# Patient Record
Sex: Female | Born: 1980 | Race: White | Hispanic: No | Marital: Single | Smoking: Never smoker
Health system: Southern US, Community
[De-identification: ages and names within clinical notes are randomized; demographics above are authoritative.]

## PROBLEM LIST (undated history)

## (undated) ENCOUNTER — Inpatient Hospital Stay (HOSPITAL_COMMUNITY): Payer: Self-pay

## (undated) DIAGNOSIS — M2550 Pain in unspecified joint: Secondary | ICD-10-CM

## (undated) DIAGNOSIS — K219 Gastro-esophageal reflux disease without esophagitis: Secondary | ICD-10-CM

## (undated) DIAGNOSIS — F429 Obsessive-compulsive disorder, unspecified: Secondary | ICD-10-CM

## (undated) DIAGNOSIS — G4700 Insomnia, unspecified: Secondary | ICD-10-CM

## (undated) DIAGNOSIS — J45909 Unspecified asthma, uncomplicated: Secondary | ICD-10-CM

## (undated) DIAGNOSIS — F1193 Opioid use, unspecified with withdrawal: Secondary | ICD-10-CM

## (undated) DIAGNOSIS — M26609 Unspecified temporomandibular joint disorder, unspecified side: Secondary | ICD-10-CM

## (undated) DIAGNOSIS — G43009 Migraine without aura, not intractable, without status migrainosus: Secondary | ICD-10-CM

## (undated) DIAGNOSIS — F32A Depression, unspecified: Secondary | ICD-10-CM

## (undated) DIAGNOSIS — F319 Bipolar disorder, unspecified: Secondary | ICD-10-CM

## (undated) DIAGNOSIS — K5790 Diverticulosis of intestine, part unspecified, without perforation or abscess without bleeding: Secondary | ICD-10-CM

## (undated) DIAGNOSIS — G8929 Other chronic pain: Secondary | ICD-10-CM

## (undated) DIAGNOSIS — Z8669 Personal history of other diseases of the nervous system and sense organs: Secondary | ICD-10-CM

## (undated) DIAGNOSIS — F329 Major depressive disorder, single episode, unspecified: Secondary | ICD-10-CM

## (undated) DIAGNOSIS — F11988 Opioid use, unspecified with other opioid-induced disorder: Secondary | ICD-10-CM

## (undated) DIAGNOSIS — F419 Anxiety disorder, unspecified: Secondary | ICD-10-CM

## (undated) DIAGNOSIS — R002 Palpitations: Secondary | ICD-10-CM

## (undated) DIAGNOSIS — F410 Panic disorder [episodic paroxysmal anxiety] without agoraphobia: Secondary | ICD-10-CM

## (undated) DIAGNOSIS — M1990 Unspecified osteoarthritis, unspecified site: Secondary | ICD-10-CM

## (undated) DIAGNOSIS — F1123 Opioid dependence with withdrawal: Secondary | ICD-10-CM

## (undated) DIAGNOSIS — F1120 Opioid dependence, uncomplicated: Secondary | ICD-10-CM

## (undated) DIAGNOSIS — F4310 Post-traumatic stress disorder, unspecified: Secondary | ICD-10-CM

## (undated) DIAGNOSIS — D649 Anemia, unspecified: Secondary | ICD-10-CM

## (undated) DIAGNOSIS — G5600 Carpal tunnel syndrome, unspecified upper limb: Secondary | ICD-10-CM

## (undated) DIAGNOSIS — M549 Dorsalgia, unspecified: Secondary | ICD-10-CM

## (undated) HISTORY — PX: TOTAL KNEE ARTHROPLASTY: SHX125

## (undated) HISTORY — PX: ANTERIOR CRUCIATE LIGAMENT REPAIR: SHX115

## (undated) HISTORY — DX: Palpitations: R00.2

## (undated) HISTORY — PX: BACK SURGERY: SHX140

## (undated) HISTORY — PX: TOOTH EXTRACTION: SUR596

## (undated) HISTORY — PX: DILATION AND CURETTAGE OF UTERUS: SHX78

## (undated) HISTORY — DX: Post-traumatic stress disorder, unspecified: F43.10

## (undated) HISTORY — DX: Opioid use, unspecified with other opioid-induced disorder: F11.988

## (undated) HISTORY — DX: Unspecified temporomandibular joint disorder, unspecified side: M26.609

## (undated) HISTORY — DX: Opioid dependence, uncomplicated: F11.20

## (undated) HISTORY — DX: Opioid use, unspecified with withdrawal: F11.93

## (undated) HISTORY — PX: COLONOSCOPY: SHX174

## (undated) HISTORY — DX: Migraine without aura, not intractable, without status migrainosus: G43.009

## (undated) HISTORY — DX: Opioid dependence with withdrawal: F11.23

---

## 2001-02-24 ENCOUNTER — Emergency Department (HOSPITAL_COMMUNITY): Admission: EM | Admit: 2001-02-24 | Discharge: 2001-02-24 | Payer: Self-pay | Admitting: Emergency Medicine

## 2001-07-17 ENCOUNTER — Emergency Department (HOSPITAL_COMMUNITY): Admission: EM | Admit: 2001-07-17 | Discharge: 2001-07-17 | Payer: Self-pay | Admitting: Emergency Medicine

## 2002-03-23 ENCOUNTER — Encounter: Admission: RE | Admit: 2002-03-23 | Discharge: 2002-06-21 | Payer: Self-pay | Admitting: Internal Medicine

## 2002-10-24 ENCOUNTER — Other Ambulatory Visit: Admission: RE | Admit: 2002-10-24 | Discharge: 2002-10-24 | Payer: Self-pay | Admitting: Obstetrics and Gynecology

## 2003-09-03 ENCOUNTER — Other Ambulatory Visit: Admission: RE | Admit: 2003-09-03 | Discharge: 2003-09-03 | Payer: Self-pay | Admitting: Obstetrics and Gynecology

## 2004-05-20 ENCOUNTER — Emergency Department (HOSPITAL_COMMUNITY): Admission: EM | Admit: 2004-05-20 | Discharge: 2004-05-20 | Payer: Self-pay | Admitting: Emergency Medicine

## 2005-11-09 HISTORY — PX: DILATION AND EVACUATION: SHX1459

## 2007-06-14 ENCOUNTER — Inpatient Hospital Stay (HOSPITAL_COMMUNITY): Admission: AD | Admit: 2007-06-14 | Discharge: 2007-06-16 | Payer: Self-pay | Admitting: Obstetrics and Gynecology

## 2008-08-22 ENCOUNTER — Ambulatory Visit (HOSPITAL_COMMUNITY): Admission: RE | Admit: 2008-08-22 | Discharge: 2008-08-22 | Payer: Self-pay | Admitting: Family Medicine

## 2008-10-07 ENCOUNTER — Encounter (INDEPENDENT_AMBULATORY_CARE_PROVIDER_SITE_OTHER): Payer: Self-pay | Admitting: Obstetrics and Gynecology

## 2008-10-07 ENCOUNTER — Inpatient Hospital Stay (HOSPITAL_COMMUNITY): Admission: AD | Admit: 2008-10-07 | Discharge: 2008-10-09 | Payer: Self-pay | Admitting: Obstetrics and Gynecology

## 2009-10-20 IMAGING — US US OB COMP +14 WK
1 series · 14 of 28 positions shown · non-contrast
Comparison: none

OBSTETRICAL ULTRASOUND:
 This ultrasound exam was performed in the [HOSPITAL] Ultrasound Department.  The OB US report was generated in the AS system, and faxed to the ordering physician.  This report is also available in [REDACTED] PACS.

[Series 1: us ob detail +14 wk · 14 of 70 slices shown]
[im 3/70]
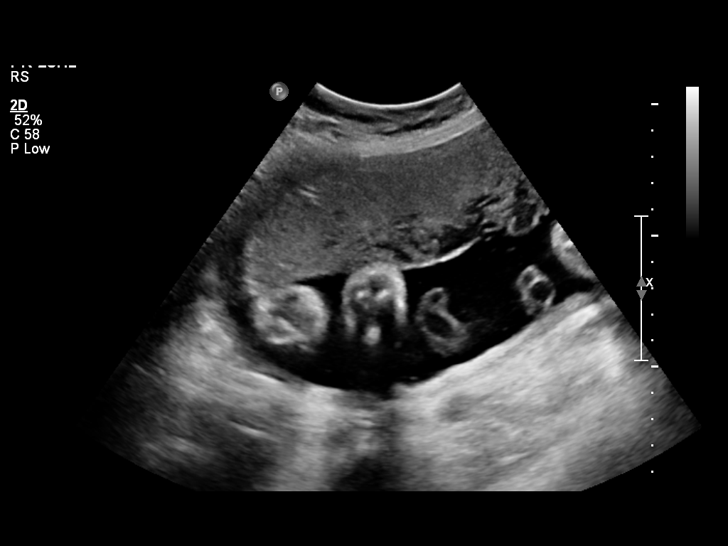
[im 8/70]
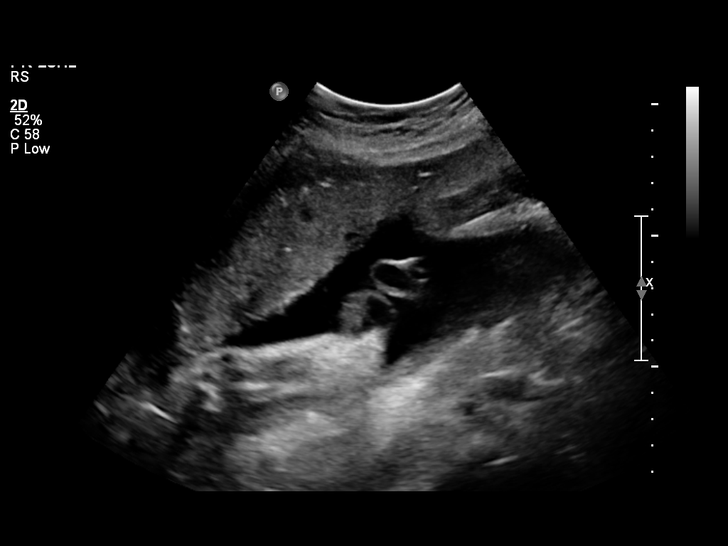
[im 13/70]
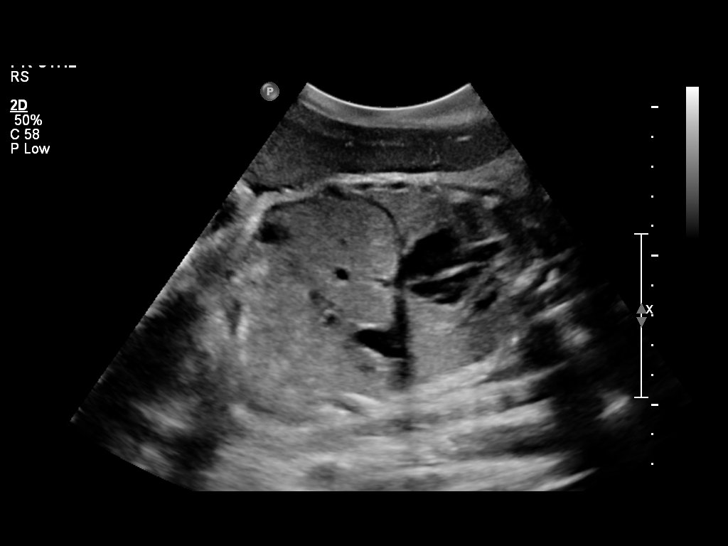
[im 18/70]
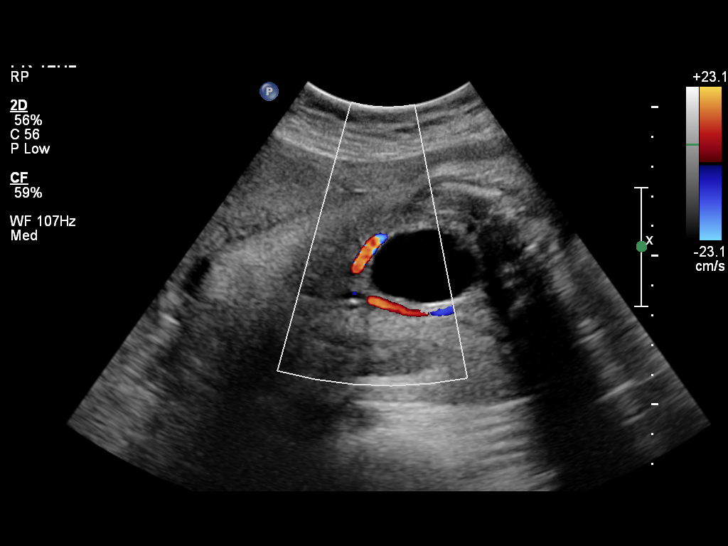
[im 24/70]
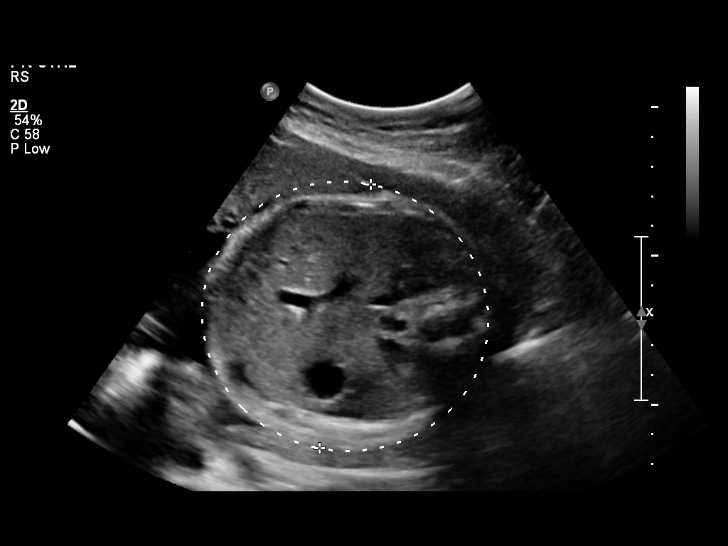
[im 29/70]
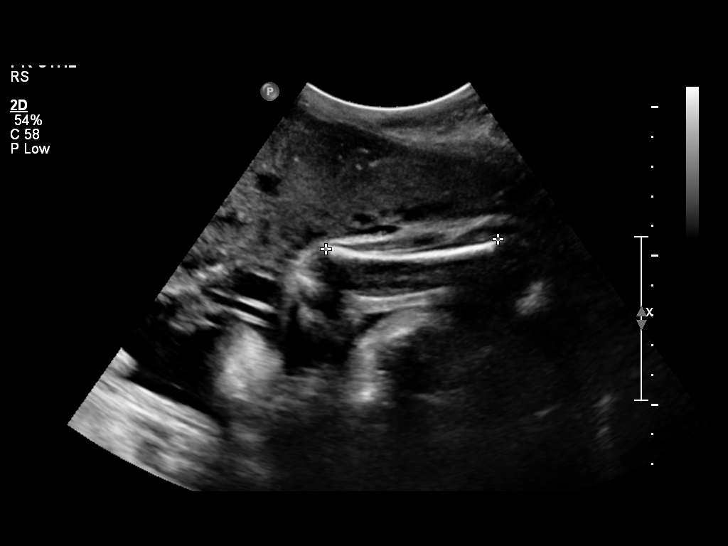
[im 34/70]
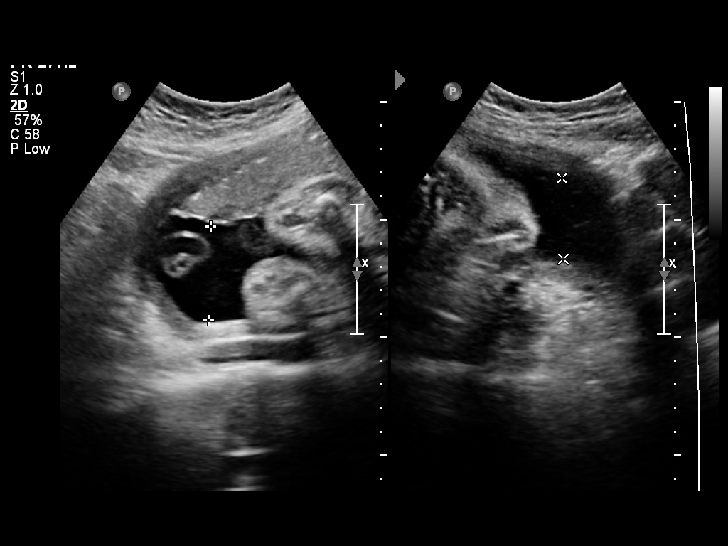
[im 39/70]
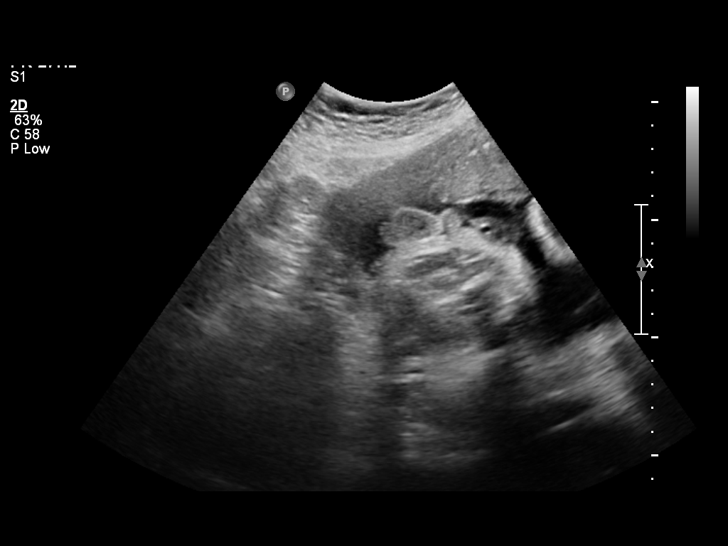
[im 44/70]
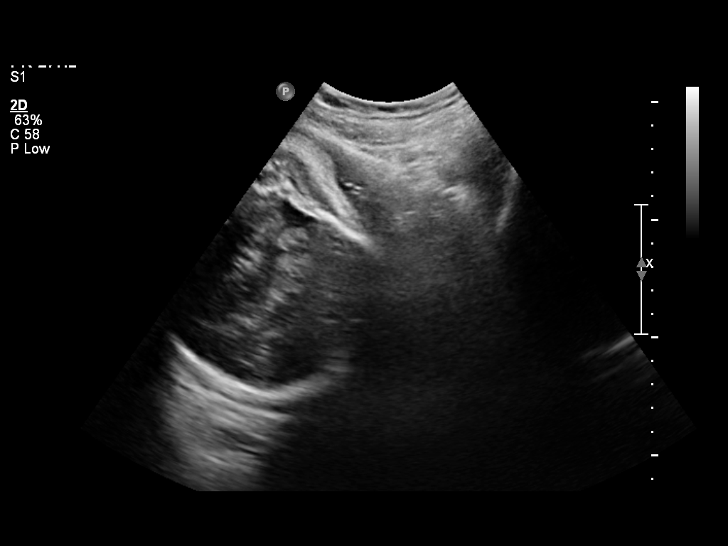
[im 49/70]
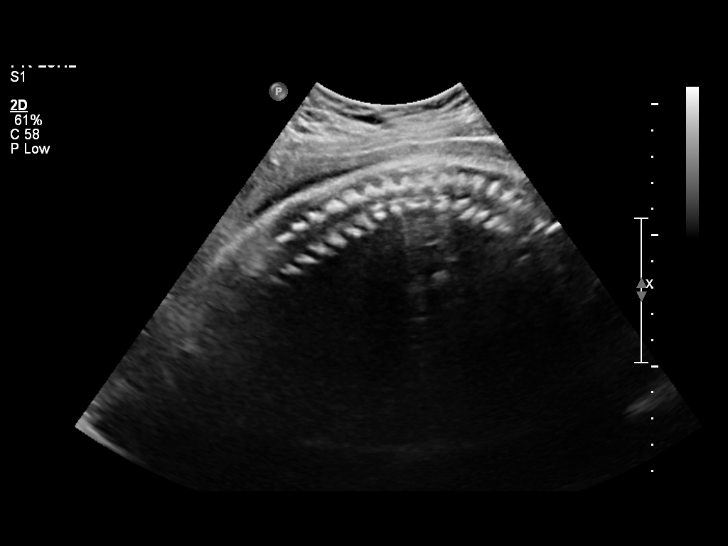
[im 54/70]
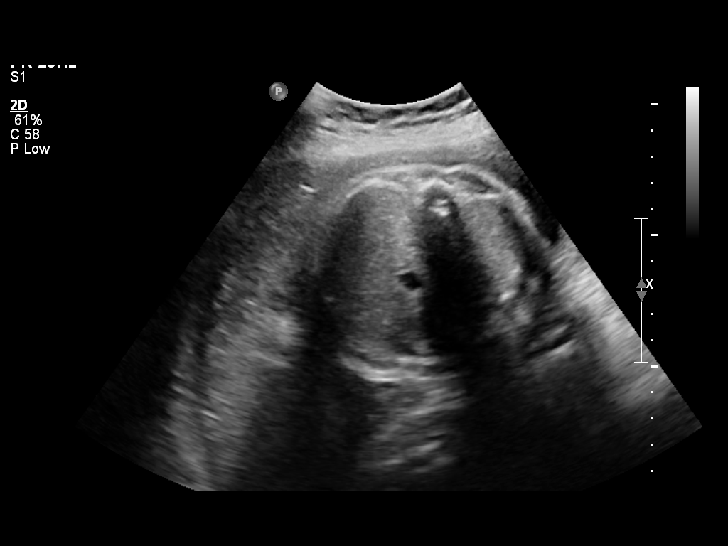
[im 59/70]
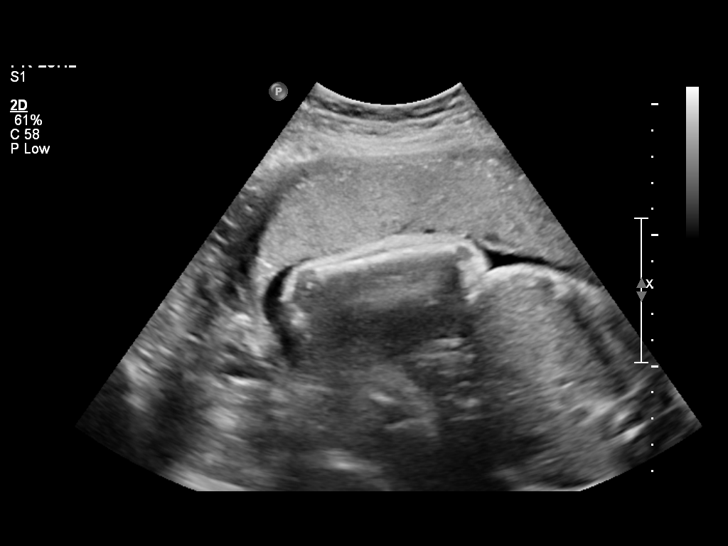
[im 64/70]
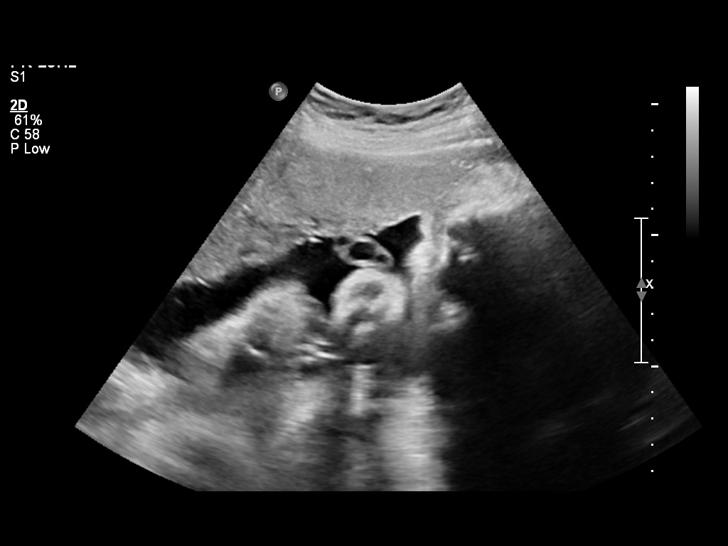
[im 70/70]
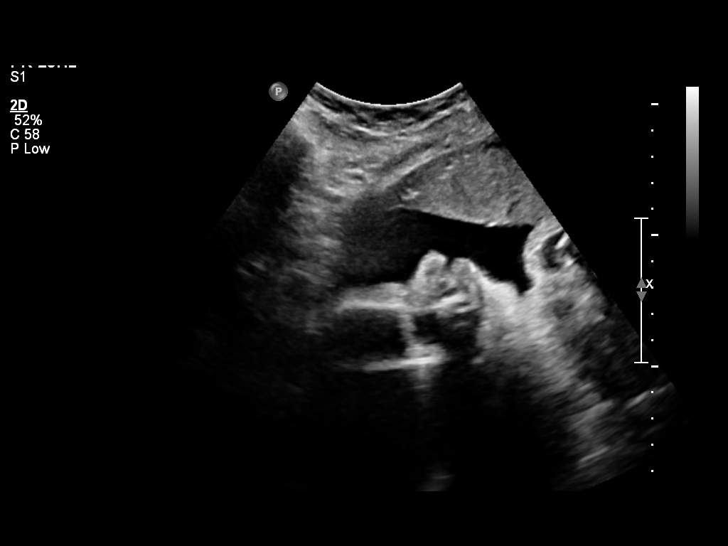

[14 of 28 positions shown; findings below may reference images not displayed]

IMPRESSION: See [facility] Obstetric US report.

## 2010-11-30 ENCOUNTER — Encounter: Payer: Self-pay | Admitting: Sports Medicine

## 2011-08-11 LAB — BASIC METABOLIC PANEL
CO2: 30 mEq/L (ref 19–32)
Chloride: 102 mEq/L (ref 96–112)
GFR calc non Af Amer: >60 mL/min (ref >60)
Glucose, Bld: 82 mg/dL (ref 70–99)
Potassium: 4.2 mEq/L (ref 3.5–5.1)

## 2011-08-11 LAB — GLUCOSE, CAPILLARY: Glucose-Capillary: 84 mg/dL (ref 70–99)

## 2011-08-11 LAB — CBC
HCT: 34.5 % — ABNORMAL LOW (ref 36.0–46.0)
HCT: 34.7 % — ABNORMAL LOW (ref 36.0–46.0)
MCHC: 34 g/dL (ref 30.0–36.0)
MCHC: 34.3 g/dL (ref 30.0–36.0)
MCHC: 34.4 g/dL (ref 30.0–36.0)
MCV: 97.8 fL (ref 78.0–100.0)
MCV: 98.2 fL (ref 78.0–100.0)
RBC: 3.22 MIL/uL — ABNORMAL LOW (ref 3.87–5.11)
WBC: 10.8 10*3/uL — ABNORMAL HIGH (ref 4.0–10.5)
WBC: 9.9 10*3/uL (ref 4.0–10.5)

## 2011-08-11 LAB — RPR: RPR Ser Ql: NONREACTIVE

## 2011-08-24 LAB — RPR: RPR Ser Ql: NONREACTIVE

## 2011-08-24 LAB — CBC
HCT: 34.4 — ABNORMAL LOW
HCT: 38.8
Hemoglobin: 11.9 — ABNORMAL LOW
MCHC: 34.7
MCV: 93.6
Platelets: 211
RBC: 3.72 — ABNORMAL LOW
RBC: 4.14
WBC: 13.4 — ABNORMAL HIGH

## 2014-05-29 ENCOUNTER — Encounter (HOSPITAL_COMMUNITY): Payer: Self-pay | Admitting: Emergency Medicine

## 2014-05-29 ENCOUNTER — Emergency Department (HOSPITAL_COMMUNITY): Payer: Medicaid Other

## 2014-05-29 ENCOUNTER — Emergency Department (HOSPITAL_COMMUNITY)
Admission: EM | Admit: 2014-05-29 | Discharge: 2014-05-29 | Discharge status: Against Medical Advice | Payer: Medicaid Other | Attending: Emergency Medicine | Admitting: Emergency Medicine

## 2014-05-29 DIAGNOSIS — IMO0002 Reserved for concepts with insufficient information to code with codable children: Secondary | ICD-10-CM | POA: Insufficient documentation

## 2014-05-29 DIAGNOSIS — M5412 Radiculopathy, cervical region: Secondary | ICD-10-CM | POA: Diagnosis not present

## 2014-05-29 DIAGNOSIS — M545 Low back pain, unspecified: Secondary | ICD-10-CM | POA: Diagnosis present

## 2014-05-29 DIAGNOSIS — R292 Abnormal reflex: Secondary | ICD-10-CM | POA: Diagnosis not present

## 2014-05-29 DIAGNOSIS — M5416 Radiculopathy, lumbar region: Secondary | ICD-10-CM

## 2014-05-29 DIAGNOSIS — Z79899 Other long term (current) drug therapy: Secondary | ICD-10-CM | POA: Insufficient documentation

## 2014-05-29 LAB — CBC WITH DIFFERENTIAL/PLATELET
BASOS PCT: 0 % (ref 0–1)
Basophils Absolute: 0 10*3/uL (ref 0.0–0.1)
EOS ABS: 0.2 10*3/uL (ref 0.0–0.7)
EOS PCT: 2 % (ref 0–5)
HCT: 35.3 % — ABNORMAL LOW (ref 36.0–46.0)
HEMOGLOBIN: 11.7 g/dL — AB (ref 12.0–15.0)
LYMPHS ABS: 2.9 10*3/uL (ref 0.7–4.0)
Lymphocytes Relative: 33 % (ref 12–46)
MCH: 31.9 pg (ref 26.0–34.0)
MCHC: 33.1 g/dL (ref 30.0–36.0)
MCV: 96.2 fL (ref 78.0–100.0)
MONO ABS: 0.7 10*3/uL (ref 0.1–1.0)
Monocytes Relative: 8 % (ref 3–12)
Neutro Abs: 4.9 10*3/uL (ref 1.7–7.7)
Neutrophils Relative %: 57 % (ref 43–77)
Platelets: 154 10*3/uL (ref 150–400)
RBC: 3.67 MIL/uL — AB (ref 3.87–5.11)
RDW: 13.2 % (ref 11.5–15.5)
WBC: 8.7 10*3/uL (ref 4.0–10.5)

## 2014-05-29 LAB — URINALYSIS, ROUTINE W REFLEX MICROSCOPIC
BILIRUBIN URINE: NEGATIVE
Glucose, UA: NEGATIVE mg/dL
HGB URINE DIPSTICK: NEGATIVE
Ketones, ur: NEGATIVE mg/dL
LEUKOCYTES UA: NEGATIVE
Nitrite: NEGATIVE
PROTEIN: NEGATIVE mg/dL
SPECIFIC GRAVITY, URINE: 1.025 (ref 1.005–1.030)
Urobilinogen, UA: 1 mg/dL (ref 0.0–1.0)
pH: 6.5 (ref 5.0–8.0)

## 2014-05-29 LAB — I-STAT TROPONIN, ED: TROPONIN I, POC: 0 ng/mL (ref 0.00–0.08)

## 2014-05-29 MED ORDER — HYDROMORPHONE HCL PF 1 MG/ML IJ SOLN
1.0000 mg | Freq: Once | INTRAMUSCULAR | Status: DC
  Filled 2014-05-29: qty 1

## 2014-05-29 MED ORDER — HYDROMORPHONE HCL PF 1 MG/ML IJ SOLN
0.5000 mg | Freq: Once | INTRAMUSCULAR | Status: AC
  Administered 2014-05-29: 0.5 mg via INTRAVENOUS
  Filled 2014-05-29: qty 1

## 2014-05-29 MED ORDER — SODIUM CHLORIDE 0.9 % IV SOLN
Freq: Once | INTRAVENOUS | Status: AC
  Administered 2014-05-29: 16:00:00 via INTRAVENOUS

## 2014-05-29 MED ORDER — ONDANSETRON HCL 4 MG/2ML IJ SOLN
4.0000 mg | Freq: Once | INTRAMUSCULAR | Status: AC
  Administered 2014-05-29: 4 mg via INTRAVENOUS
  Filled 2014-05-29: qty 2

## 2014-05-29 NOTE — ED Notes (Signed)
Pt placed in gown and in bed. Pt monitored by pulse ox, bp cuff, and 12-lead. 
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
## 2014-05-29 NOTE — ED Notes (Signed)
PT returned from scans. Pt monitored by pulse ox, bp cuff, and 12-lead. 

## 2014-05-29 NOTE — ED Notes (Signed)
Patient ambulated to exit of ER without any problems.

## 2014-05-29 NOTE — ED Provider Notes (Signed)
Medical screening examination/treatment/procedure(s) were performed by non-physician practitioner and as supervising physician I was immediately available for consultation/collaboration.   EKG Interpretation None        Shon Baton, MD 05/29/14 2204

## 2014-05-29 NOTE — ED Notes (Signed)
Pt states shes had a history of back pain but this week shes noticed new numbness between her legs and today she lost control of her bladder.

## 2014-06-20 ENCOUNTER — Encounter (HOSPITAL_COMMUNITY): Payer: Self-pay

## 2014-06-20 ENCOUNTER — Inpatient Hospital Stay (HOSPITAL_COMMUNITY): Payer: Medicaid Other

## 2014-06-20 ENCOUNTER — Inpatient Hospital Stay (HOSPITAL_COMMUNITY)
Admission: AD | Admit: 2014-06-20 | Discharge: 2014-06-20 | Disposition: A | Discharge status: Home / Self Care | Payer: Medicaid Other | Source: Ambulatory Visit | Attending: Obstetrics and Gynecology | Admitting: Obstetrics and Gynecology

## 2014-06-20 DIAGNOSIS — R109 Unspecified abdominal pain: Secondary | ICD-10-CM | POA: Insufficient documentation

## 2014-06-20 DIAGNOSIS — N83209 Unspecified ovarian cyst, unspecified side: Secondary | ICD-10-CM | POA: Diagnosis not present

## 2014-06-20 DIAGNOSIS — Z30432 Encounter for removal of intrauterine contraceptive device: Secondary | ICD-10-CM | POA: Insufficient documentation

## 2014-06-20 DIAGNOSIS — R102 Pelvic and perineal pain: Secondary | ICD-10-CM

## 2014-06-20 DIAGNOSIS — N949 Unspecified condition associated with female genital organs and menstrual cycle: Secondary | ICD-10-CM | POA: Insufficient documentation

## 2014-06-20 LAB — URINALYSIS, ROUTINE W REFLEX MICROSCOPIC
BILIRUBIN URINE: NEGATIVE
Glucose, UA: NEGATIVE mg/dL
Hgb urine dipstick: NEGATIVE
Ketones, ur: 15 mg/dL — AB
LEUKOCYTES UA: NEGATIVE
NITRITE: NEGATIVE
PH: 6 (ref 5.0–8.0)
PROTEIN: NEGATIVE mg/dL
Specific Gravity, Urine: 1.025 (ref 1.005–1.030)
Urobilinogen, UA: 0.2 mg/dL (ref 0.0–1.0)

## 2014-06-20 LAB — CBC
HEMATOCRIT: 39.3 % (ref 36.0–46.0)
HEMOGLOBIN: 13.6 g/dL (ref 12.0–15.0)
MCH: 33.2 pg (ref 26.0–34.0)
MCHC: 34.6 g/dL (ref 30.0–36.0)
MCV: 95.9 fL (ref 78.0–100.0)
Platelets: 172 10*3/uL (ref 150–400)
RBC: 4.1 MIL/uL (ref 3.87–5.11)
RDW: 13.2 % (ref 11.5–15.5)
WBC: 9 10*3/uL (ref 4.0–10.5)

## 2014-06-20 LAB — WET PREP, GENITAL
Clue Cells Wet Prep HPF POC: NONE SEEN
Trich, Wet Prep: NONE SEEN
WBC, Wet Prep HPF POC: NONE SEEN
Yeast Wet Prep HPF POC: NONE SEEN

## 2014-06-20 LAB — POCT PREGNANCY, URINE: Preg Test, Ur: NEGATIVE

## 2014-06-20 MED ORDER — KETOROLAC TROMETHAMINE 60 MG/2ML IM SOLN
60.0000 mg | Freq: Once | INTRAMUSCULAR | Status: AC
  Administered 2014-06-20: 60 mg via INTRAMUSCULAR
  Filled 2014-06-20: qty 2

## 2014-06-20 MED ORDER — ONDANSETRON 8 MG PO TBDP: 8.0000 mg | ORAL_TABLET | Freq: Once | ORAL | Status: DC

## 2014-06-20 MED ORDER — ONDANSETRON 8 MG PO TBDP
8.0000 mg | ORAL_TABLET | Freq: Once | ORAL | Status: AC
  Administered 2014-06-20: 8 mg via ORAL
  Filled 2014-06-20: qty 1

## 2014-06-20 NOTE — MAU Note (Signed)
Pain started 2 months ago. Severe pain last 2 wks. Dx with ovarian cysts. Office told her to come here for IUD removal and to be thoroughly checked out.

## 2014-06-20 NOTE — Discharge Instructions (Signed)
Levonorgestrel intrauterine device (IUD) What is this medicine? LEVONORGESTREL IUD (LEE voe nor jes trel) is a contraceptive (birth control) device. The device is placed inside the uterus by a healthcare professional. It is used to prevent pregnancy and can also be used to treat heavy bleeding that occurs during your period. Depending on the device, it can be used for 3 to 5 years. This medicine may be used for other purposes; ask your health care provider or pharmacist if you have questions. COMMON BRAND NAME(S): LILETTA, Mirena, Skyla What should I tell my health care provider before I take this medicine? They need to know if you have any of these conditions: -abnormal Pap smear -cancer of the breast, uterus, or cervix -diabetes -endometritis -genital or pelvic infection now or in the past -have more than one sexual partner or your partner has more than one partner -heart disease -history of an ectopic or tubal pregnancy -immune system problems -IUD in place -liver disease or tumor -problems with blood clots or take blood-thinners -use intravenous drugs -uterus of unusual shape -vaginal bleeding that has not been explained -an unusual or allergic reaction to levonorgestrel, other hormones, silicone, or polyethylene, medicines, foods, dyes, or preservatives -pregnant or trying to get pregnant -breast-feeding How should I use this medicine? This device is placed inside the uterus by a health care professional. Talk to your pediatrician regarding the use of this medicine in children. Special care may be needed. Overdosage: If you think you have taken too much of this medicine contact a poison control center or emergency room at once. NOTE: This medicine is only for you. Do not share this medicine with others. What if I miss a dose? This does not apply. What may interact with this medicine? Do not take this medicine with any of the following  medications: -amprenavir -bosentan -fosamprenavir This medicine may also interact with the following medications: -aprepitant -barbiturate medicines for inducing sleep or treating seizures -bexarotene -griseofulvin -medicines to treat seizures like carbamazepine, ethotoin, felbamate, oxcarbazepine, phenytoin, topiramate -modafinil -pioglitazone -rifabutin -rifampin -rifapentine -some medicines to treat HIV infection like atazanavir, indinavir, lopinavir, nelfinavir, tipranavir, ritonavir -St. John's wort -warfarin This list may not describe all possible interactions. Give your health care provider a list of all the medicines, herbs, non-prescription drugs, or dietary supplements you use. Also tell them if you smoke, drink alcohol, or use illegal drugs. Some items may interact with your medicine. What should I watch for while using this medicine? Visit your doctor or health care professional for regular check ups. See your doctor if you or your partner has sexual contact with others, becomes HIV positive, or gets a sexual transmitted disease. This product does not protect you against HIV infection (AIDS) or other sexually transmitted diseases. You can check the placement of the IUD yourself by reaching up to the top of your vagina with clean fingers to feel the threads. Do not pull on the threads. It is a good habit to check placement after each menstrual period. Call your doctor right away if you feel more of the IUD than just the threads or if you cannot feel the threads at all. The IUD may come out by itself. You may become pregnant if the device comes out. If you notice that the IUD has come out use a backup birth control method like condoms and call your health care provider. Using tampons will not change the position of the IUD and are okay to use during your period. What side effects may   I notice from receiving this medicine? Side effects that you should report to your doctor or  health care professional as soon as possible: -allergic reactions like skin rash, itching or hives, swelling of the face, lips, or tongue -fever, flu-like symptoms -genital sores -high blood pressure -no menstrual period for 6 weeks during use -pain, swelling, warmth in the leg -pelvic pain or tenderness -severe or sudden headache -signs of pregnancy -stomach cramping -sudden shortness of breath -trouble with balance, talking, or walking -unusual vaginal bleeding, discharge -yellowing of the eyes or skin Side effects that usually do not require medical attention (report to your doctor or health care professional if they continue or are bothersome): -acne -breast pain -change in sex drive or performance -changes in weight -cramping, dizziness, or faintness while the device is being inserted -headache -irregular menstrual bleeding within first 3 to 6 months of use -nausea This list may not describe all possible side effects. Call your doctor for medical advice about side effects. You may report side effects to FDA at 1-800-FDA-1088. Where should I keep my medicine? This does not apply. NOTE: This sheet is a summary. It may not cover all possible information. If you have questions about this medicine, talk to your doctor, pharmacist, or health care provider.  2015, [employer]. (2011-11-26 13:54:04)  

## 2014-06-20 NOTE — MAU Note (Addendum)
IUD was to come out in Jan.  Has not had periods while IUD was in place. Has had low grade off and on, highest was 101.

## 2014-06-20 NOTE — MAU Provider Note (Signed)
History     CSN: [id number]  Arrival date and time: 06/20/14 1400   First Provider Initiated Contact with Patient 06/20/14 1841      Chief Complaint  Patient presents with  . Abdominal Pain   HPI  [name] is a 33 y.o. female G5P2 who presents with complaints of abdominal pain; she has an IUD and was due to have it out in January (placed 5 years ago).  She called Dr. Ewell Poe office today to schedule an appointment today and they were unable to get her in the office. She was seen at [facility] ER 1 week ago and diagnosed with a right ovarian cyst 2.6 cm (records reviewed); she was given narcotic pain medication and she has taken it all.  She developed a fever at home one week ago; today her fever was 100.9; she took ibuprofen. Negative fever in MAU.  The patient wants her IUD to come out today.    OB History   Grav Para Term Preterm Abortions TAB SAB Ect Mult Living   5 2              Past Medical History  Diagnosis Date  . Back pain     Past Surgical History  Procedure Laterality Date  . Total knee arthroplasty      History reviewed. No pertinent family history.  History  Substance Use Topics  . Smoking status: Never Smoker   . Smokeless tobacco: Not on file  . Alcohol Use: No    Allergies:  Allergies  Allergen Reactions  . Bee Venom Anaphylaxis    Prescriptions prior to admission  Medication Sig Dispense Refill  . albuterol (PROVENTIL HFA;VENTOLIN HFA) 108 (90 BASE) MCG/ACT inhaler Inhale 2 puffs into the lungs every 6 (six) hours as needed for wheezing or shortness of breath.      . clonazePAM (KLONOPIN) 0.5 MG tablet Take 0.5 mg by mouth 2 (two) times daily.      . diphenhydrAMINE (BENADRYL) 25 MG tablet Take 25 mg by mouth every 8 (eight) hours as needed for allergies.      Marland Kitchen ibuprofen (ADVIL,MOTRIN) 200 MG tablet Take 400 mg by mouth every 6 (six) hours as needed for fever.      . Multiple Vitamins-Minerals (HAIR/SKIN/NAILS PO) Take 1 tablet by  mouth daily.      Marland Kitchen oxyCODONE (OXY IR/ROXICODONE) 5 MG immediate release tablet Take 5 mg by mouth every 6 (six) hours as needed for moderate pain or severe pain.        Results for orders placed during the hospital encounter of 06/20/14 (from the past 48 hour(s))  URINALYSIS, ROUTINE W REFLEX MICROSCOPIC     Status: Abnormal   Collection Time    06/20/14  2:40 PM      Result Value Ref Range   Color, Urine YELLOW  YELLOW   APPearance CLEAR  CLEAR   Specific Gravity, Urine 1.025  1.005 - 1.030   pH 6.0  5.0 - 8.0   Glucose, UA NEGATIVE  NEGATIVE mg/dL   Hgb urine dipstick NEGATIVE  NEGATIVE   Bilirubin Urine NEGATIVE  NEGATIVE   Ketones, ur 15 (*) NEGATIVE mg/dL   Protein, ur NEGATIVE  NEGATIVE mg/dL   Urobilinogen, UA 0.2  0.0 - 1.0 mg/dL   Nitrite NEGATIVE  NEGATIVE   Leukocytes, UA NEGATIVE  NEGATIVE   Comment: MICROSCOPIC NOT DONE ON URINES WITH NEGATIVE PROTEIN, BLOOD, LEUKOCYTES, NITRITE, OR GLUCOSE <1000 mg/dL.  POCT PREGNANCY, URINE  Status: None   Collection Time    06/20/14  6:14 PM      Result Value Ref Range   Preg Test, Ur NEGATIVE  NEGATIVE   Comment:            THE SENSITIVITY OF THIS     METHODOLOGY IS >24 mIU/mL    Review of Systems  Constitutional: Positive for fever and chills.  Gastrointestinal: Positive for abdominal pain (Right lower- mid abdominal pain ). Negative for diarrhea and constipation.  Genitourinary: Negative for dysuria, urgency, frequency and hematuria.       + vaginal discharge. + vaginal bleeding. No dysuria.    Physical Exam   Blood pressure 129/66, pulse 87, temperature 98.2 F (36.8 C), temperature source Oral, resp. rate 18, height 6\' 1"  (1.854 m), weight 75.014 kg (165 lb 6 oz).  Physical Exam  Constitutional: She is oriented to person, place, and time. She appears well-developed and well-nourished. No distress.  HENT:  Head: Normocephalic.  Eyes: Pupils are equal, round, and reactive to light.  Neck: Neck supple.   Cardiovascular: Normal rate.   Respiratory: Effort normal.  GI: Soft. Bowel sounds are normal. She exhibits no distension and no mass. There is no tenderness. There is no rebound and no guarding.  Genitourinary:  Speculum exam: Vagina - Small amount of clear discharge, no odor Cervix - No contact bleeding IUD strings visualized in the cervix; IUD pulled using ring forceps with minimal resistance. No bleeding noted following. Chaperone present for exam.   Musculoskeletal: Normal range of motion.  Neurological: She is alert and oriented to person, place, and time.  Skin: Skin is warm. She is not diaphoretic.  Psychiatric: Her speech is normal. Her mood appears anxious. Her affect is inappropriate. She exhibits a depressed mood.   Results for orders placed during the hospital encounter of 06/20/14 (from the past 24 hour(s))  URINALYSIS, ROUTINE W REFLEX MICROSCOPIC     Status: Abnormal   Collection Time    06/20/14  2:40 PM      Result Value Ref Range   Color, Urine YELLOW  YELLOW   APPearance CLEAR  CLEAR   Specific Gravity, Urine 1.025  1.005 - 1.030   pH 6.0  5.0 - 8.0   Glucose, UA NEGATIVE  NEGATIVE mg/dL   Hgb urine dipstick NEGATIVE  NEGATIVE   Bilirubin Urine NEGATIVE  NEGATIVE   Ketones, ur 15 (*) NEGATIVE mg/dL   Protein, ur NEGATIVE  NEGATIVE mg/dL   Urobilinogen, UA 0.2  0.0 - 1.0 mg/dL   Nitrite NEGATIVE  NEGATIVE   Leukocytes, UA NEGATIVE  NEGATIVE  POCT PREGNANCY, URINE     Status: None   Collection Time    06/20/14  6:14 PM      Result Value Ref Range   Preg Test, Ur NEGATIVE  NEGATIVE  WET PREP, GENITAL     Status: None   Collection Time    06/20/14  7:15 PM      Result Value Ref Range   Yeast Wet Prep HPF POC NONE SEEN  NONE SEEN   Trich, Wet Prep NONE SEEN  NONE SEEN   Clue Cells Wet Prep HPF POC NONE SEEN  NONE SEEN   WBC, Wet Prep HPF POC NONE SEEN  NONE SEEN  CBC     Status: None   Collection Time    06/20/14  8:26 PM      Result Value Ref Range    WBC 9.0  4.0 - 10.5  K/uL   RBC 4.10  3.87 - 5.11 MIL/uL   Hemoglobin 13.6  12.0 - 15.0 g/dL   HCT 57.8  46.9 - 62.9 %   MCV 95.9  78.0 - 100.0 fL   MCH 33.2  26.0 - 34.0 pg   MCHC 34.6  30.0 - 36.0 g/dL   RDW 52.8  41.3 - 24.4 %   Platelets 172  150 - 400 K/uL   US Transvaginal Non-ob  06/20/2014   CLINICAL DATA:  Severe abdominal pain.  EXAM: TRANSABDOMINAL AND TRANSVAGINAL ULTRASOUND OF PELVIS  DOPPLER ULTRASOUND OF OVARIES  TECHNIQUE: Both transabdominal and transvaginal ultrasound examinations of the pelvis were performed. Transabdominal technique was performed for global imaging of the pelvis including uterus, ovaries, adnexal regions, and pelvic cul-de-sac.  It was necessary to proceed with endovaginal exam following the transabdominal exam to visualize the uterus, endometrium and ovaries. Color and duplex Doppler ultrasound was utilized to evaluate blood flow to the ovaries.  COMPARISON:  CT, 05/02/2012  FINDINGS: Uterus  Measurements: 8.2 cm x 3.5 cm x 4.5 cm. No fibroids or other mass visualized.  Endometrium  Thickness: 2.3 mm.  No focal abnormality visualized.  Right ovary  Measurements: 2.7 cm x 3 cm x 2.6 cm. 3 cm simple appearing cyst. No solid mass or complex cysts. No adnexal mass.  Left ovary  Measurements: 2.0 cm x 0.6 cm by 0.9 cm. Normal appearance/no adnexal mass.  Pulsed Doppler evaluation of both ovaries demonstrates normal low-resistance arterial and venous waveforms.  Other findings  No free fluid.  IMPRESSION: 1. 3 cm simple appearing right ovarian cyst. This is almost certainly benign, and no specific imaging follow up is recommended according to the Society of Radiologists in Ultrasound2010 Consensus Conference Statement (D Lenis Noon et al. Management of Asymptomatic Ovarian and Other Adnexal Cysts Imaged at [place]: Society of Radiologists in Ultrasound Consensus Conference Statement 2010. Radiology 256 (Sept 2010): 943-954.). 2. Exam otherwise unremarkable. No findings to explain  this patient's pain.   Electronically Signed   By: Amie Portland M.D.   On: 06/20/2014 20:20   US Pelvis Complete  06/20/2014   CLINICAL DATA:  Severe abdominal pain.  EXAM: TRANSABDOMINAL AND TRANSVAGINAL ULTRASOUND OF PELVIS  DOPPLER ULTRASOUND OF OVARIES  TECHNIQUE: Both transabdominal and transvaginal ultrasound examinations of the pelvis were performed. Transabdominal technique was performed for global imaging of the pelvis including uterus, ovaries, adnexal regions, and pelvic cul-de-sac.  It was necessary to proceed with endovaginal exam following the transabdominal exam to visualize the uterus, endometrium and ovaries. Color and duplex Doppler ultrasound was utilized to evaluate blood flow to the ovaries.  COMPARISON:  CT, 05/02/2012  FINDINGS: Uterus  Measurements: 8.2 cm x 3.5 cm x 4.5 cm. No fibroids or other mass visualized.  Endometrium  Thickness: 2.3 mm.  No focal abnormality visualized.  Right ovary  Measurements: 2.7 cm x 3 cm x 2.6 cm. 3 cm simple appearing cyst. No solid mass or complex cysts. No adnexal mass.  Left ovary  Measurements: 2.0 cm x 0.6 cm by 0.9 cm. Normal appearance/no adnexal mass.  Pulsed Doppler evaluation of both ovaries demonstrates normal low-resistance arterial and venous waveforms.  Other findings  No free fluid.  IMPRESSION: 1. 3 cm simple appearing right ovarian cyst. This is almost certainly benign, and no specific imaging follow up is recommended according to the Society of Radiologists in Ultrasound2010 Consensus Conference Statement Grier Rocher et al. Management of Asymptomatic Ovarian and Other Adnexal Cysts Imaged at [place]: Society  of Radiologists in Ultrasound Consensus Conference Statement 2010. Radiology 256 (Sept 2010): [phone].). 2. Exam otherwise unremarkable. No findings to explain this patient's pain.   Electronically Signed   By: Amie Portland M.D.   On: 06/20/2014 20:20   [place] Art/ven Flow Abd Pelv Doppler  06/20/2014   CLINICAL DATA:  Severe abdominal pain.   EXAM: TRANSABDOMINAL AND TRANSVAGINAL ULTRASOUND OF PELVIS  DOPPLER ULTRASOUND OF OVARIES  TECHNIQUE: Both transabdominal and transvaginal ultrasound examinations of the pelvis were performed. Transabdominal technique was performed for global imaging of the pelvis including uterus, ovaries, adnexal regions, and pelvic cul-de-sac.  It was necessary to proceed with endovaginal exam following the transabdominal exam to visualize the uterus, endometrium and ovaries. Color and duplex Doppler ultrasound was utilized to evaluate blood flow to the ovaries.  COMPARISON:  CT, 05/02/2012  FINDINGS: Uterus  Measurements: 8.2 cm x 3.5 cm x 4.5 cm. No fibroids or other mass visualized.  Endometrium  Thickness: 2.3 mm.  No focal abnormality visualized.  Right ovary  Measurements: 2.7 cm x 3 cm x 2.6 cm. 3 cm simple appearing cyst. No solid mass or complex cysts. No adnexal mass.  Left ovary  Measurements: 2.0 cm x 0.6 cm by 0.9 cm. Normal appearance/no adnexal mass.  Pulsed Doppler evaluation of both ovaries demonstrates normal low-resistance arterial and venous waveforms.  Other findings  No free fluid.  IMPRESSION: 1. 3 cm simple appearing right ovarian cyst. This is almost certainly benign, and no specific imaging follow up is recommended according to the Society of Radiologists in Ultrasound2010 Consensus Conference Statement (D Lenis Noon et al. Management of Asymptomatic Ovarian and Other Adnexal Cysts Imaged at [place]: Society of Radiologists in Ultrasound Consensus Conference Statement 2010. Radiology 256 (Sept 2010): 943-954.). 2. Exam otherwise unremarkable. No findings to explain this patient's pain.   Electronically Signed   By: Amie Portland M.D.   On: 06/20/2014 20:20    MAU Course  Procedures None  MDM CBC Toradol 60 mg IM Consulted with Dr. Claiborne Billings; ok to pull IUD as requested by the patient  Pelvic US to evaluate cyst that was found 1 week ago at another facility.  Report given to [facility] who  resumes care of the patient   Iona Hansen Rasch, NP  06/20/2014, 6:41 PM   2000 - Patient in [place], labs pending. Care assumed from Venia Carbon, NP US shows stable simple appearing ovarian cyst Wet prep is negative CBC is normal Patient may be discharged to follow-up in the office Assessment and Plan  A: Pelvic pain IUD removal  P: Discharge home Rx for Zofran given to patient at patient's request Patient advised to follow-up in the office as scheduled Patient may return to MAU as needed or if her condition were to change or worsen  Freddi Starr, PA-C  06/20/2014 8:44 PM

## 2014-06-21 ENCOUNTER — Other Ambulatory Visit: Payer: Self-pay | Admitting: Neurosurgery

## 2014-06-21 LAB — GC/CHLAMYDIA PROBE AMP
CT PROBE, AMP APTIMA: NEGATIVE
GC PROBE AMP APTIMA: NEGATIVE

## 2014-09-10 ENCOUNTER — Encounter (HOSPITAL_COMMUNITY): Payer: Self-pay

## 2014-10-22 ENCOUNTER — Other Ambulatory Visit: Payer: Self-pay | Admitting: Neurosurgery

## 2014-10-22 ENCOUNTER — Inpatient Hospital Stay (HOSPITAL_COMMUNITY)
Admission: RE | Admit: 2014-10-22 | Discharge: 2014-10-22 | Disposition: A | Discharge status: Home / Self Care | Payer: Medicaid Other | Source: Ambulatory Visit

## 2014-10-22 NOTE — Progress Notes (Signed)
Reached a relative of a pt. On a phone line provided by Dr. Jordan Likes 's office. The relative reports the pt's schedule is cancelled.

## 2014-10-22 NOTE — Progress Notes (Signed)
Call to Dr. Lindalou Hose office for orders for PAT.

## 2014-10-25 MED ORDER — CEFAZOLIN SODIUM-DEXTROSE 2-3 GM-% IV SOLR: 2.0000 g | INTRAVENOUS | Status: DC

## 2014-10-25 MED ORDER — DEXAMETHASONE SODIUM PHOSPHATE 10 MG/ML IJ SOLN: 10.0000 mg | INTRAMUSCULAR | Status: DC

## 2014-10-26 ENCOUNTER — Encounter (HOSPITAL_COMMUNITY): Admission: RE | Payer: Self-pay | Source: Ambulatory Visit

## 2014-10-26 ENCOUNTER — Inpatient Hospital Stay (HOSPITAL_COMMUNITY): Admission: RE | Admit: 2014-10-26 | Payer: Medicaid Other | Source: Ambulatory Visit | Admitting: Neurosurgery

## 2014-10-26 SURGERY — FOR MAXIMUM ACCESS (MAS) POSTERIOR LUMBAR INTERBODY FUSION (PLIF) 2 LEVEL
Anesthesia: General | Site: Back

## 2014-11-09 NOTE — L&D Delivery Note (Signed)
Pt was admitted for induction. She had AROM with clear fluid this am and then pit aug. She followed along a nl labor curve. She pushed for 30 min and had a SVD on one live viable white female over an intact perineum in the ROA position. Shoulder dystocia txd with McRoberts/ Woods screw and suprapubic pressure. Placenta manually removed. No perineal tears. Baby to NBN. NICU was called to delivery

## 2014-12-17 ENCOUNTER — Other Ambulatory Visit: Payer: Self-pay | Admitting: Obstetrics and Gynecology

## 2014-12-17 LAB — OB RESULTS CONSOLE RUBELLA ANTIBODY, IGM: RUBELLA: IMMUNE

## 2014-12-17 LAB — OB RESULTS CONSOLE ABO/RH: RH TYPE: POSITIVE

## 2014-12-17 LAB — OB RESULTS CONSOLE HIV ANTIBODY (ROUTINE TESTING): HIV: NONREACTIVE

## 2014-12-17 LAB — OB RESULTS CONSOLE RPR: RPR: NONREACTIVE

## 2014-12-17 LAB — OB RESULTS CONSOLE GC/CHLAMYDIA
CHLAMYDIA, DNA PROBE: NEGATIVE
GC PROBE AMP, GENITAL: NEGATIVE

## 2014-12-17 LAB — OB RESULTS CONSOLE ANTIBODY SCREEN: Antibody Screen: NEGATIVE

## 2014-12-17 LAB — OB RESULTS CONSOLE HEPATITIS B SURFACE ANTIGEN: HEP B S AG: NEGATIVE

## 2014-12-18 LAB — CYTOLOGY - PAP

## 2015-03-21 ENCOUNTER — Encounter (HOSPITAL_COMMUNITY): Payer: Self-pay | Admitting: *Deleted

## 2015-03-21 ENCOUNTER — Inpatient Hospital Stay (HOSPITAL_COMMUNITY)
Admission: AD | Admit: 2015-03-21 | Discharge: 2015-03-21 | Disposition: A | Discharge status: Home / Self Care | Payer: Medicaid Other | Source: Ambulatory Visit | Attending: Obstetrics | Admitting: Obstetrics

## 2015-03-21 DIAGNOSIS — O368190 Decreased fetal movements, unspecified trimester, not applicable or unspecified: Secondary | ICD-10-CM | POA: Diagnosis not present

## 2015-03-21 DIAGNOSIS — M545 Low back pain: Secondary | ICD-10-CM | POA: Insufficient documentation

## 2015-03-21 DIAGNOSIS — Z3A27 27 weeks gestation of pregnancy: Secondary | ICD-10-CM | POA: Insufficient documentation

## 2015-03-21 DIAGNOSIS — O368120 Decreased fetal movements, second trimester, not applicable or unspecified: Secondary | ICD-10-CM | POA: Insufficient documentation

## 2015-03-21 DIAGNOSIS — O26893 Other specified pregnancy related conditions, third trimester: Secondary | ICD-10-CM

## 2015-03-21 DIAGNOSIS — M549 Dorsalgia, unspecified: Secondary | ICD-10-CM | POA: Diagnosis present

## 2015-03-21 HISTORY — DX: Unspecified asthma, uncomplicated: J45.909

## 2015-03-21 HISTORY — DX: Anemia, unspecified: D64.9

## 2015-03-21 HISTORY — DX: Gastro-esophageal reflux disease without esophagitis: K21.9

## 2015-03-21 HISTORY — DX: Obsessive-compulsive disorder, unspecified: F42.9

## 2015-03-21 HISTORY — DX: Anxiety disorder, unspecified: F41.9

## 2015-03-21 LAB — URINALYSIS, ROUTINE W REFLEX MICROSCOPIC
Bilirubin Urine: NEGATIVE
Glucose, UA: NEGATIVE mg/dL
Hgb urine dipstick: NEGATIVE
KETONES UR: 40 mg/dL — AB
LEUKOCYTES UA: NEGATIVE
Nitrite: NEGATIVE
PROTEIN: NEGATIVE mg/dL
Specific Gravity, Urine: >1.03 — ABNORMAL HIGH (ref 1.005–1.030)
Urobilinogen, UA: 0.2 mg/dL (ref 0.0–1.0)
pH: 5.5 (ref 5.0–8.0)

## 2015-03-21 NOTE — MAU Provider Note (Signed)
History     CSN: [id number]  Arrival date and time: 03/21/15 1816   None     Chief Complaint  Patient presents with  . Back Pain  . Anxiety  . Decreased Fetal Movement   Back Pain This is a recurrent problem. The current episode started today. The problem occurs constantly. The problem is unchanged. The pain is present in the lumbar spine. The quality of the pain is described as aching. The pain does not radiate. The pain is moderate. The symptoms are aggravated by bending and twisting. Pertinent negatives include no abdominal pain, bladder incontinence, bowel incontinence, fever, headaches, leg pain or weakness. She has tried nothing for the symptoms.   This is a 34 y.o. female at [redacted]w[redacted]d who presents with c/o low back pain and decreased fetal movement. Has had problems with anxiety over the past few days. Denies leaking or bleeding.   RN Note"   Expand All Collapse All   Pt presents to MAU with complaints of lower back pain, decrease in fetal movement and anxiety over the last couple of days. Denies any vaginal bleeding or LOF          OB History    Gravida Para Term Preterm AB TAB SAB Ectopic Multiple Living   6 2 2  3  3   2       Past Medical History  Diagnosis Date  . Back pain   . Anxiety   . Obsessive compulsive disorder   . Post traumatic stress disorder (PTSD)   . Vaginal Pap smear, abnormal   . Anemia   . Headache   . GERD (gastroesophageal reflux disease)   . Asthma     Past Surgical History  Procedure Laterality Date  . Total knee arthroplasty    . Dilation and evacuation      No family history on file.  History  Substance Use Topics  . Smoking status: Never Smoker   . Smokeless tobacco: Never Used  . Alcohol Use: No    Allergies:  Allergies  Allergen Reactions  . Bee Venom Anaphylaxis  . 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 albuterol (PROVENTIL  HFA;VENTOLIN HFA) 108 (90 BASE) MCG/ACT inhaler Inhale 2 puffs into the lungs every 6 (six) hours as needed for wheezing or shortness of breath.   Past Week at Unknown time  . clonazePAM (KLONOPIN) 0.5 MG tablet Take 0.5 mg by mouth 2 (two) times daily.   06/19/2014 at Unknown time  . diphenhydrAMINE (BENADRYL) 25 MG tablet Take 25 mg by mouth every 8 (eight) hours as needed for allergies.   Past Month at Unknown time  . ibuprofen (ADVIL,MOTRIN) 200 MG tablet Take 400 mg by mouth every 6 (six) hours as needed for fever.   06/20/2014 at Unknown time  . Multiple Vitamins-Minerals (HAIR/SKIN/NAILS PO) Take 1 tablet by mouth daily.   Past Week at Unknown time  . ondansetron (ZOFRAN-ODT) 8 MG disintegrating tablet Take 1 tablet (8 mg total) by mouth once. 20 tablet 0   . oxyCODONE (OXY IR/ROXICODONE) 5 MG immediate release tablet Take 5 mg by mouth every 6 (six) hours as needed for moderate pain or severe pain.   Past Week at Unknown time    Review of Systems  Constitutional: Negative for fever, chills and malaise/fatigue.  Gastrointestinal: Negative for nausea, vomiting, abdominal pain, diarrhea, constipation and bowel incontinence.  Genitourinary: Negative for bladder incontinence.  Musculoskeletal:  Positive for back pain. Negative for myalgias, joint pain, falls and neck pain.  Neurological: Negative for dizziness, focal weakness, weakness and headaches.   Physical Exam   Blood pressure 134/83, pulse 78, temperature 97.7 F (36.5 C), temperature source Oral, resp. rate 18, height 5\' 10"  (1.778 m), weight 217 lb (98.431 kg), last menstrual period 09/11/2014.  Physical Exam  Constitutional: She is oriented to person, place, and time. She appears well-developed and well-nourished. No distress.  HENT:  Head: Normocephalic.  Cardiovascular: Normal rate, regular rhythm and normal heart sounds.  Exam reveals no gallop and no friction rub.   No murmur heard. Respiratory: Effort normal.  GI: Soft.   Musculoskeletal: Normal range of motion.  Neurological: She is alert and oriented to person, place, and time.  Skin: Skin is warm and dry.  Psychiatric: She has a normal mood and affect.    MAU Course  Procedures  MDM Consulted Dr 13/01/2014 Results for GILDA, ABBOUD (MRN Trilby Leaver) as of 03/26/2015 03:41  Ref. Range 03/21/2015 18:40  Appearance Latest Ref Range: CLEAR  CLEAR  Bilirubin Urine Latest Ref Range: NEGATIVE  NEGATIVE  Color, Urine Latest Ref Range: YELLOW  YELLOW  Glucose Latest Ref Range: NEGATIVE mg/dL NEGATIVE  Hgb urine dipstick Latest Ref Range: NEGATIVE  NEGATIVE  Ketones, ur Latest Ref Range: NEGATIVE mg/dL 40 (A)  Leukocytes, UA Latest Ref Range: NEGATIVE  NEGATIVE  Nitrite Latest Ref Range: NEGATIVE  NEGATIVE  pH Latest Ref Range: 5.0-8.0  5.5  Protein Latest Ref Range: NEGATIVE mg/dL NEGATIVE  Specific Gravity, Urine Latest Ref Range: 1.005-1.030  >1.030 (H)  Urobilinogen, UA Latest Ref Range: 0.0-1.0 mg/dL 0.2   Assessment and Plan  A:  SIUP at [redacted]w[redacted]d       Decreased fetal movement, feels good movement now      Reassuring fetal heart rate tracing      Low back pain  P:  Consulted Dr [redacted]w[redacted]d       DIscharge home       Discussed kick counts and fetal movement patterns       Discussed back care, heat, massage, rest       Follow up in office for prenatal care       Come back if movement decreases again  [facility] 03/21/2015, 6:58 PM

## 2015-03-21 NOTE — Discharge Instructions (Signed)
Third Trimester of Pregnancy The third trimester is from week 29 through week 42, months 7 through 9. The third trimester is a time when the fetus is growing rapidly. At the end of the ninth month, the fetus is about 20 inches in length and weighs 6-10 pounds.  BODY CHANGES Your body goes through many changes during pregnancy. The changes vary from woman to woman.   Your weight will continue to increase. You can expect to gain 25-35 pounds (11-16 kg) by the end of the pregnancy. You may begin to get stretch marks on your hips, abdomen, and breasts.Back Pain in Pregnancy Back pain during pregnancy is common. It happens in about half of all pregnancies. It is important for you and your baby that you remain active during your pregnancy.If you feel that back pain is not allowing you to remain active or sleep well, it is time to see your caregiver. Back pain may be caused by several factors related to changes during your pregnancy.Fortunately, unless you had trouble with your back before your pregnancy, the pain is likely to get better after you deliver. Low back pain usually occurs between the fifth and seventh months of pregnancy. It can, however, happen in the first couple months. Factors that increase the risk of back problems include:   Previous back problems.  Injury to your back.  Having twins or multiple births.  A chronic cough.  Stress.  Job-related repetitive motions.  Muscle or spinal disease in the back.  Family history of back problems, ruptured (herniated) discs, or osteoporosis.  Depression, anxiety, and panic attacks. CAUSES   When you are pregnant, your body produces a hormone called relaxin. This hormonemakes the ligaments connecting the low back and pubic bones more flexible. This flexibility allows the baby to be delivered more easily. When your ligaments are loose, your muscles need to work harder to support your back. Soreness in your back can come from tired  muscles. Soreness can also come from back tissues that are irritated since they are receiving less support.  As the baby grows, it puts pressure on the nerves and blood vessels in your pelvis. This can cause back pain.  As the baby grows and gets heavier during pregnancy, the uterus pushes the stomach muscles forward and changes your center of gravity. This makes your back muscles work harder to maintain good posture. SYMPTOMS  Lumbar pain during pregnancy Lumbar pain during pregnancy usually occurs at or above the waist in the center of the back. There may be pain and numbness that radiates into your leg or foot. This is similar to low back pain experienced by non-pregnant women. It usually increases with sitting for long periods of time, standing, or repetitive lifting. Tenderness may also be present in the muscles along your upper back. Posterior pelvic pain during pregnancy Pain in the back of the pelvis is more common than lumbar pain in pregnancy. It is a deep pain felt in your side at the waistline, or across the tailbone (sacrum), or in both places. You may have pain on one or both sides. This pain can also go into the buttocks and backs of the upper thighs. Pubic and groin pain may also be present. The pain does not quickly resolve with rest, and morning stiffness may also be present. Pelvic pain during pregnancy can be brought on by most activities. A high level of fitness before and during pregnancy may or may not prevent this problem. Labor pain is usually 1 to  2 minutes apart, lasts for about 1 minute, and involves a bearing down feeling or pressure in your pelvis. However, if you are at term with the pregnancy, constant low back pain can be the beginning of early labor, and you should be aware of this. DIAGNOSIS  X-rays of the back should not be done during the first 12 to 14 weeks of the pregnancy and only when absolutely necessary during the rest of the pregnancy. MRIs do not give off  radiation and are safe during pregnancy. MRIs also should only be done when absolutely necessary. HOME CARE INSTRUCTIONS  Exercise as directed by your caregiver. Exercise is the most effective way to prevent or manage back pain. If you have a back problem, it is especially important to avoid sports that require sudden body movements. Swimming and walking are great activities.  Do not stand in one place for long periods of time.  Do not wear high heels.  Sit in chairs with good posture. Use a pillow on your lower back if necessary. Make sure your head rests over your shoulders and is not hanging forward.  Try sleeping on your side, preferably the left side, with a pillow or two between your legs. If you are sore after a night's rest, your bedmay betoo soft.Try placing a board between your mattress and box spring.  Listen to your body when lifting.If you are experiencing pain, ask for help or try bending yourknees more so you can use your leg muscles rather than your back muscles. Squat down when picking up something from the floor. Do not bend over.  Eat a healthy diet. Try to gain weight within your caregiver's recommendations.  Use heat or cold packs 3 to 4 times a day for 15 minutes to help with the pain.  Only take over-the-counter or prescription medicines for pain, discomfort, or fever as directed by your caregiver. Sudden (acute) back pain  Use bed rest for only the most extreme, acute episodes of back pain. Prolonged bed rest over 48 hours will aggravate your condition.  Ice is very effective for acute conditions.  Put ice in a plastic bag.  Place a towel between your skin and the bag.  Leave the ice on for 10 to 20 minutes every 2 hours, or as needed.  Using heat packs for 30 minutes prior to activities is also helpful. Continued back pain See your caregiver if you have continued problems. Your caregiver can help or refer you for appropriate physical therapy. With  conditioning, most back problems can be avoided. Sometimes, a more serious issue may be the cause of back pain. You should be seen right away if new problems seem to be developing. Your caregiver may recommend:  A maternity girdle.  An elastic sling.  A back brace.  A massage therapist or acupuncture. SEEK MEDICAL CARE IF:   You are not able to do most of your daily activities, even when taking the pain medicine you were given.  You need a referral to a physical therapist or chiropractor.  You want to try acupuncture. SEEK IMMEDIATE MEDICAL CARE IF:  You develop numbness, tingling, weakness, or problems with the use of your arms or legs.  You develop severe back pain that is no longer relieved with medicines.  You have a sudden change in bowel or bladder control.  You have increasing pain in other areas of the body.  You develop shortness of breath, dizziness, or fainting.  You develop nausea, vomiting, or sweating.  You have back pain which is similar to labor pains.  You have back pain along with your water breaking or vaginal bleeding.  You have back pain or numbness that travels down your leg.  Your back pain developed after you fell.  You develop pain on one side of your back. You may have a kidney stone.  You see blood in your urine. You may have a bladder infection or kidney stone.  You have back pain with blisters. You may have shingles. Back pain is fairly common during pregnancy but should not be accepted as just part of the process. Back pain should always be treated as soon as possible. This will make your pregnancy as pleasant as possible. Document Released: 02/03/2006 Document Revised: 01/18/2012 Document Reviewed: 03/17/2011 [facility] Patient Information 2015 [place], [place]. This information is not intended to replace advice given to you by your health care provider. Make sure you discuss any questions you have with your health care provider.    You may  urinate more often because the fetus is moving lower into your pelvis and pressing on your bladder.  You may develop or continue to have heartburn as a result of your pregnancy.  You may develop constipation because certain hormones are causing the muscles that push waste through your intestines to slow down.  You may develop hemorrhoids or swollen, bulging veins (varicose veins).  You may have pelvic pain because of the weight gain and pregnancy hormones relaxing your joints between the bones in your pelvis. Backaches may result from overexertion of the muscles supporting your posture.  You may have changes in your hair. These can include thickening of your hair, rapid growth, and changes in texture. Some women also have hair loss during or after pregnancy, or hair that feels dry or thin. Your hair will most likely return to normal after your baby is born.  Your breasts will continue to grow and be tender. A yellow discharge may leak from your breasts called colostrum.  Your belly button may stick out.  You may feel short of breath because of your expanding uterus.  You may notice the fetus "dropping," or moving lower in your abdomen.  You may have a bloody mucus discharge. This usually occurs a few days to a week before labor begins.  Your cervix becomes thin and soft (effaced) near your due date. WHAT TO EXPECT AT YOUR PRENATAL EXAMS  You will have prenatal exams every 2 weeks until week 36. Then, you will have weekly prenatal exams. During a routine prenatal visit:  You will be weighed to make sure you and the fetus are growing normally.  Your blood pressure is taken.  Your abdomen will be measured to track your baby's growth.  The fetal heartbeat will be listened to.  Any test results from the previous visit will be discussed.  You may have a cervical check near your due date to see if you have effaced. At around 36 weeks, your caregiver will check your cervix. At the same  time, your caregiver will also perform a test on the secretions of the vaginal tissue. This test is to determine if a type of bacteria, Group B streptococcus, is present. Your caregiver will explain this further. Your caregiver may ask you:  What your birth plan is.  How you are feeling.  If you are feeling the baby move.  If you have had any abnormal symptoms, such as leaking fluid, bleeding, severe headaches, or abdominal cramping.  If you  have any questions. Other tests or screenings that may be performed during your third trimester include:  Blood tests that check for low iron levels (anemia).  Fetal testing to check the health, activity level, and growth of the fetus. Testing is done if you have certain medical conditions or if there are problems during the pregnancy. FALSE LABOR You may feel small, irregular contractions that eventually go away. These are called Braxton Hicks contractions, or false labor. Contractions may last for hours, days, or even weeks before true labor sets in. If contractions come at regular intervals, intensify, or become painful, it is best to be seen by your caregiver.  SIGNS OF LABOR   Menstrual-like cramps.  Contractions that are 5 minutes apart or less.  Contractions that start on the top of the uterus and spread down to the lower abdomen and back.  A sense of increased pelvic pressure or back pain.  A watery or bloody mucus discharge that comes from the vagina. If you have any of these signs before the 37th week of pregnancy, call your caregiver right away. You need to go to the hospital to get checked immediately. HOME CARE INSTRUCTIONS   Avoid all smoking, herbs, alcohol, and unprescribed drugs. These chemicals affect the formation and growth of the baby.  Follow your caregiver's instructions regarding medicine use. There are medicines that are either safe or unsafe to take during pregnancy.  Exercise only as directed by your caregiver.  Experiencing uterine cramps is a good sign to stop exercising.  Continue to eat regular, healthy meals.  Wear a good support bra for breast tenderness.  Do not use hot tubs, steam rooms, or saunas.  Wear your seat belt at all times when driving.  Avoid raw meat, uncooked cheese, cat litter boxes, and soil used by cats. These carry germs that can cause birth defects in the baby.  Take your prenatal vitamins.  Try taking a stool softener (if your caregiver approves) if you develop constipation. Eat more high-fiber foods, such as fresh vegetables or fruit and whole grains. Drink plenty of fluids to keep your urine clear or pale yellow.  Take warm sitz baths to soothe any pain or discomfort caused by hemorrhoids. Use hemorrhoid cream if your caregiver approves.  If you develop varicose veins, wear support hose. Elevate your feet for 15 minutes, 3-4 times a day. Limit salt in your diet.  Avoid heavy lifting, wear low heal shoes, and practice good posture.  Rest a lot with your legs elevated if you have leg cramps or low back pain.  Visit your dentist if you have not gone during your pregnancy. Use a soft toothbrush to brush your teeth and be gentle when you floss.  A sexual relationship may be continued unless your caregiver directs you otherwise.  Do not travel far distances unless it is absolutely necessary and only with the approval of your caregiver.  Take prenatal classes to understand, practice, and ask questions about the labor and delivery.  Make a trial run to the hospital.  Pack your hospital bag.  Prepare the baby's nursery.  Continue to go to all your prenatal visits as directed by your caregiver. SEEK MEDICAL CARE IF:  You are unsure if you are in labor or if your water has broken.  You have dizziness.  You have mild pelvic cramps, pelvic pressure, or nagging pain in your abdominal area.  You have persistent nausea, vomiting, or diarrhea.  You have a bad  smelling vaginal discharge.  You have pain with urination. SEEK IMMEDIATE MEDICAL CARE IF:   You have a fever.  You are leaking fluid from your vagina.  You have spotting or bleeding from your vagina.  You have severe abdominal cramping or pain.  You have rapid weight loss or gain.  You have shortness of breath with chest pain.  You notice sudden or extreme swelling of your face, hands, ankles, feet, or legs.  You have not felt your baby move in over an hour.  You have severe headaches that do not go away with medicine.  You have vision changes. Document Released: 10/20/2001 Document Revised: 10/31/2013 Document Reviewed: 12/27/2012 [facility] Patient Information 2015 [place], [place]. This information is not intended to replace advice given to you by your health care provider. Make sure you discuss any questions you have with your health care provider.

## 2015-03-21 NOTE — MAU Note (Signed)
Pt presents to MAU with complaints of lower back pain, decrease in fetal movement and anxiety over the last couple of days. Denies any vaginal bleeding or LOF

## 2015-05-17 ENCOUNTER — Other Ambulatory Visit: Payer: Self-pay | Admitting: Obstetrics and Gynecology

## 2015-05-17 LAB — OB RESULTS CONSOLE GBS: GBS: NEGATIVE

## 2015-05-23 ENCOUNTER — Encounter (HOSPITAL_COMMUNITY): Payer: Self-pay

## 2015-05-23 ENCOUNTER — Inpatient Hospital Stay (HOSPITAL_COMMUNITY)
Admission: AD | Admit: 2015-05-23 | Discharge: 2015-05-23 | Disposition: A | Discharge status: Home / Self Care | Payer: Medicaid Other | Source: Ambulatory Visit | Attending: Obstetrics | Admitting: Obstetrics

## 2015-05-23 DIAGNOSIS — Z3A36 36 weeks gestation of pregnancy: Secondary | ICD-10-CM | POA: Insufficient documentation

## 2015-05-23 DIAGNOSIS — Z3493 Encounter for supervision of normal pregnancy, unspecified, third trimester: Secondary | ICD-10-CM | POA: Insufficient documentation

## 2015-05-23 LAB — URINALYSIS, ROUTINE W REFLEX MICROSCOPIC
Bilirubin Urine: NEGATIVE
Glucose, UA: NEGATIVE mg/dL
Hgb urine dipstick: NEGATIVE
Ketones, ur: NEGATIVE mg/dL
Leukocytes, UA: NEGATIVE
NITRITE: NEGATIVE
PH: 7 (ref 5.0–8.0)
Protein, ur: NEGATIVE mg/dL
Specific Gravity, Urine: 1.02 (ref 1.005–1.030)
UROBILINOGEN UA: 0.2 mg/dL (ref 0.0–1.0)

## 2015-05-23 NOTE — MAU Note (Signed)
Pt presents complaining of contractions all night and this am. Contractions are 5-8 minutes apart. Also having lower back pain. States she has some bloody show. Denies leaking. Reports fetal movement.

## 2015-05-23 NOTE — Discharge Instructions (Signed)
Fetal Movement Counts °Patient Name: __________________________________________________ Patient Due Date: ____________________ °Performing a fetal movement count is highly recommended in high-risk pregnancies, but it is good for every pregnant woman to do. Your health care provider may ask you to start counting fetal movements at 28 weeks of the pregnancy. Fetal movements often increase: °· After eating a full meal. °· After physical activity. °· After eating or drinking something sweet or cold. °· At rest. °Pay attention to when you feel the baby is most active. This will help you notice a pattern of your baby's sleep and wake cycles and what factors contribute to an increase in fetal movement. It is important to perform a fetal movement count at the same time each day when your baby is normally most active.  °HOW TO COUNT FETAL MOVEMENTS °1. Find a quiet and comfortable area to sit or lie down on your left side. Lying on your left side provides the best blood and oxygen circulation to your baby. °2. Write down the day and time on a sheet of paper or in a journal. °3. Start counting kicks, flutters, swishes, rolls, or jabs in a 2-hour period. You should feel at least 10 movements within 2 hours. °4. If you do not feel 10 movements in 2 hours, wait 2-3 hours and count again. Look for a change in the pattern or not enough counts in 2 hours. °SEEK MEDICAL CARE IF: °· You feel less than 10 counts in 2 hours, tried twice. °· There is no movement in over an hour. °· The pattern is changing or taking longer each day to reach 10 counts in 2 hours. °· You feel the baby is not moving as he or she usually does. °Date: ____________ Movements: ____________ Start time: ____________ Finish time: ____________  °Date: ____________ Movements: ____________ Start time: ____________ Finish time: ____________ °Date: ____________ Movements: ____________ Start time: ____________ Finish time: ____________ °Date: ____________ Movements:  ____________ Start time: ____________ Finish time: ____________ °Date: ____________ Movements: ____________ Start time: ____________ Finish time: ____________ °Date: ____________ Movements: ____________ Start time: ____________ Finish time: ____________ °Date: ____________ Movements: ____________ Start time: ____________ Finish time: ____________ °Date: ____________ Movements: ____________ Start time: ____________ Finish time: ____________  °Date: ____________ Movements: ____________ Start time: ____________ Finish time: ____________ °Date: ____________ Movements: ____________ Start time: ____________ Finish time: ____________ °Date: ____________ Movements: ____________ Start time: ____________ Finish time: ____________ °Date: ____________ Movements: ____________ Start time: ____________ Finish time: ____________ °Date: ____________ Movements: ____________ Start time: ____________ Finish time: ____________ °Date: ____________ Movements: ____________ Start time: ____________ Finish time: ____________ °Date: ____________ Movements: ____________ Start time: ____________ Finish time: ____________  °Date: ____________ Movements: ____________ Start time: ____________ Finish time: ____________ °Date: ____________ Movements: ____________ Start time: ____________ Finish time: ____________ °Date: ____________ Movements: ____________ Start time: ____________ Finish time: ____________ °Date: ____________ Movements: ____________ Start time: ____________ Finish time: ____________ °Date: ____________ Movements: ____________ Start time: ____________ Finish time: ____________ °Date: ____________ Movements: ____________ Start time: ____________ Finish time: ____________ °Date: ____________ Movements: ____________ Start time: ____________ Finish time: ____________  °Date: ____________ Movements: ____________ Start time: ____________ Finish time: ____________ °Date: ____________ Movements: ____________ Start time: ____________ Finish  time: ____________ °Date: ____________ Movements: ____________ Start time: ____________ Finish time: ____________ °Date: ____________ Movements: ____________ Start time: ____________ Finish time: ____________ °Date: ____________ Movements: ____________ Start time: ____________ Finish time: ____________ °Date: ____________ Movements: ____________ Start time: ____________ Finish time: ____________ °Date: ____________ Movements: ____________ Start time: ____________ Finish time: ____________  °Date: ____________ Movements: ____________ Start time: ____________ Finish   time: ____________ °Date: ____________ Movements: ____________ Start time: ____________ Finish time: ____________ °Date: ____________ Movements: ____________ Start time: ____________ Finish time: ____________ °Date: ____________ Movements: ____________ Start time: ____________ Finish time: ____________ °Date: ____________ Movements: ____________ Start time: ____________ Finish time: ____________ °Date: ____________ Movements: ____________ Start time: ____________ Finish time: ____________ °Date: ____________ Movements: ____________ Start time: ____________ Finish time: ____________  °Date: ____________ Movements: ____________ Start time: ____________ Finish time: ____________ °Date: ____________ Movements: ____________ Start time: ____________ Finish time: ____________ °Date: ____________ Movements: ____________ Start time: ____________ Finish time: ____________ °Date: ____________ Movements: ____________ Start time: ____________ Finish time: ____________ °Date: ____________ Movements: ____________ Start time: ____________ Finish time: ____________ °Date: ____________ Movements: ____________ Start time: ____________ Finish time: ____________ °Date: ____________ Movements: ____________ Start time: ____________ Finish time: ____________  °Date: ____________ Movements: ____________ Start time: ____________ Finish time: ____________ °Date: ____________  Movements: ____________ Start time: ____________ Finish time: ____________ °Date: ____________ Movements: ____________ Start time: ____________ Finish time: ____________ °Date: ____________ Movements: ____________ Start time: ____________ Finish time: ____________ °Date: ____________ Movements: ____________ Start time: ____________ Finish time: ____________ °Date: ____________ Movements: ____________ Start time: ____________ Finish time: ____________ °Date: ____________ Movements: ____________ Start time: ____________ Finish time: ____________  °Date: ____________ Movements: ____________ Start time: ____________ Finish time: ____________ °Date: ____________ Movements: ____________ Start time: ____________ Finish time: ____________ °Date: ____________ Movements: ____________ Start time: ____________ Finish time: ____________ °Date: ____________ Movements: ____________ Start time: ____________ Finish time: ____________ °Date: ____________ Movements: ____________ Start time: ____________ Finish time: ____________ °Date: ____________ Movements: ____________ Start time: ____________ Finish time: ____________ °Document Released: 11/25/2006 Document Revised: 03/12/2014 Document Reviewed: 08/22/2012 °[employer]® Patient Information ©2015 [employer]. This information is not intended to replace advice given to you by your health care provider. Make sure you discuss any questions you have with your health care provider. °Vaginal Delivery °During delivery, your health care provider will help you give birth to your baby. During a vaginal delivery, you will work to push the baby out of your vagina. However, before you can push your baby out, a few things need to happen. The opening of your uterus (cervix) has to soften, thin out, and open up (dilate) all the way to 10 cm. Also, your baby has to move down from the uterus into your vagina.  °SIGNS OF LABOR  °Your health care provider will first need to make sure you are in labor.  Signs of labor include:  °· Passing what is called the mucous plug before labor begins. This is a small amount of blood-stained mucus. °· Having regular, painful uterine contractions.   °· The time between contractions gets shorter.   °· The discomfort and pain gradually get more intense. °· Contraction pains get worse when walking and do not go away when resting.   °· Your cervix becomes thinner (effacement) and dilates. °BEFORE THE DELIVERY °Once you are in labor and admitted into the hospital or care center, your health care provider may do the following:  °5. Perform a complete physical exam. °6. Review any complications related to pregnancy or labor.  °7. Check your blood pressure, pulse, temperature, and heart rate (vital signs).   °8. Determine if, and when, the rupture of amniotic membranes occurred. °9. Do a vaginal exam (using a sterile glove and lubricant) to determine:   °1. The position (presentation) of the baby. Is the baby's head presenting first (vertex) in the birth canal (vagina), or are the feet or buttocks first (breech)?   °2. The level (station) of the baby's head within the birth canal.   °3.   The effacement and dilatation of the cervix.   °10. An electronic fetal monitor is usually placed on your abdomen when you first arrive. This is used to monitor your contractions and the baby's heart rate. °1. When the monitor is on your abdomen (external fetal monitor), it can only pick up the frequency and length of your contractions. It cannot tell the strength of your contractions. °2. If it becomes necessary for your health care provider to know exactly how strong your contractions are or to see exactly what the baby's heart rate is doing, an internal monitor may be inserted into your vagina and uterus. Your health care provider will discuss the benefits and risks of using an internal monitor and obtain your permission before inserting the device. °3. Continuous fetal monitoring may be needed if you  have an epidural, are receiving certain medicines (such as oxytocin), or have pregnancy or labor complications. °11. An IV access tube may be placed into a vein in your arm to deliver fluids and medicines if necessary. °THREE STAGES OF LABOR AND DELIVERY °Normal labor and delivery is divided into three stages. °First Stage °This stage starts when you begin to contract regularly and your cervix begins to efface and dilate. It ends when your cervix is completely open (fully dilated). The first stage is the longest stage of labor and can last from 3 hours to 15 hours.  °Several methods are available to help with labor pain. You and your health care provider will decide which option is best for you. Options include:  °· Opioid medicines. These are strong pain medicines that you can get through your IV tube or as a shot into your muscle. These medicines lessen pain but do not make it go away completely.  °· Epidural. A medicine is given through a thin tube that is inserted in your back. The medicine numbs the lower part of your body and prevents any pain in that area. °· Paracervical pain medicine. This is an injection of an anesthetic on each side of your cervix.   °· You may request natural childbirth, which does not involve the use of pain medicines or an epidural during labor and delivery. Instead, you will use other things, such as breathing exercises, to help cope with the pain. °Second Stage °The second stage of labor begins when your cervix is fully dilated at 10 cm. It continues until you push your baby down through the birth canal and the baby is born. This stage can take only minutes or several hours. °· The location of your baby's head as it moves through the birth canal is reported as a number called a station. If the baby's head has not started its descent, the station is described as being at minus 3 (-3). When your baby's head is at the zero station, it is at the middle of the birth canal and is engaged  in the pelvis. The station of your baby helps indicate the progress of the second stage of labor. °· When your baby is born, your health care provider may hold the baby with his or her head lowered to prevent amniotic fluid, mucus, and blood from getting into the baby's lungs. The baby's mouth and nose may be suctioned with a small bulb syringe to remove any additional fluid. °· Your health care provider may then place the baby on your stomach. It is important to keep the baby from getting cold. To do this, the health care provider will dry the baby off, place the   baby directly on your skin (with no blankets between you and the baby), and cover the baby with warm, dry blankets.   °· The umbilical cord is cut. °Third Stage °During the third stage of labor, your health care provider will deliver the placenta (afterbirth) and make sure your bleeding is under control. The delivery of the placenta usually takes about 5 minutes but can take up to 30 minutes. After the placenta is delivered, a medicine may be given either by IV or injection to help contract the uterus and control bleeding. If you are planning to breastfeed, you can try to do so now. °After you deliver the placenta, your uterus should contract and get very firm. If your uterus does not remain firm, your health care provider will massage it. This is important because the contraction of the uterus helps cut off bleeding at the site where the placenta was attached to your uterus. If your uterus does not contract properly and stay firm, you may continue to bleed heavily. If there is a lot of bleeding, medicines may be given to contract the uterus and stop the bleeding.  °Document Released: 08/04/2008 Document Revised: 03/12/2014 Document Reviewed: 04/16/2013 °[employer]® Patient Information ©2015 [employer]. This information is not intended to replace advice given to you by your health care provider. Make sure you discuss any questions you have with your  health care provider. ° °

## 2015-05-23 NOTE — MAU Note (Signed)
Notified Dr. Chestine Spore patient here with ucs, every 8 minutes uncomfortable, MD reviewed efm received order to d/c home.

## 2015-06-11 ENCOUNTER — Telehealth (HOSPITAL_COMMUNITY): Payer: Self-pay | Admitting: *Deleted

## 2015-06-11 ENCOUNTER — Encounter (HOSPITAL_COMMUNITY): Payer: Self-pay | Admitting: *Deleted

## 2015-06-11 NOTE — Telephone Encounter (Signed)
Preadmission screen  

## 2015-06-13 ENCOUNTER — Encounter (HOSPITAL_COMMUNITY): Payer: Self-pay

## 2015-06-13 ENCOUNTER — Inpatient Hospital Stay (HOSPITAL_COMMUNITY): Payer: Medicaid Other | Admitting: Anesthesiology

## 2015-06-13 ENCOUNTER — Inpatient Hospital Stay (HOSPITAL_COMMUNITY)
Admission: RE | Admit: 2015-06-13 | Discharge: 2015-06-15 | DRG: 767 | Disposition: A | Discharge status: Home / Self Care | Payer: Medicaid Other | Source: Ambulatory Visit | Attending: Obstetrics and Gynecology | Admitting: Obstetrics and Gynecology

## 2015-06-13 DIAGNOSIS — Z3490 Encounter for supervision of normal pregnancy, unspecified, unspecified trimester: Secondary | ICD-10-CM

## 2015-06-13 DIAGNOSIS — Z3480 Encounter for supervision of other normal pregnancy, unspecified trimester: Secondary | ICD-10-CM

## 2015-06-13 DIAGNOSIS — K219 Gastro-esophageal reflux disease without esophagitis: Secondary | ICD-10-CM | POA: Diagnosis present

## 2015-06-13 DIAGNOSIS — Z3A39 39 weeks gestation of pregnancy: Secondary | ICD-10-CM | POA: Diagnosis present

## 2015-06-13 DIAGNOSIS — O9962 Diseases of the digestive system complicating childbirth: Secondary | ICD-10-CM | POA: Diagnosis present

## 2015-06-13 DIAGNOSIS — R102 Pelvic and perineal pain: Secondary | ICD-10-CM | POA: Diagnosis present

## 2015-06-13 LAB — TYPE AND SCREEN
ABO/RH(D): A POS
Antibody Screen: NEGATIVE

## 2015-06-13 LAB — RPR: RPR: NONREACTIVE

## 2015-06-13 LAB — CBC
HEMATOCRIT: 35.5 % — AB (ref 36.0–46.0)
Hemoglobin: 12.4 g/dL (ref 12.0–15.0)
MCH: 33.2 pg (ref 26.0–34.0)
MCHC: 34.9 g/dL (ref 30.0–36.0)
MCV: 95.2 fL (ref 78.0–100.0)
Platelets: 212 10*3/uL (ref 150–400)
RBC: 3.73 MIL/uL — ABNORMAL LOW (ref 3.87–5.11)
RDW: 13.5 % (ref 11.5–15.5)
WBC: 12.8 10*3/uL — AB (ref 4.0–10.5)

## 2015-06-13 LAB — ABO/RH: ABO/RH(D): A POS

## 2015-06-13 MED ORDER — FENTANYL 2.5 MCG/ML BUPIVACAINE 1/10 % EPIDURAL INFUSION (WH - ANES)
14.0000 mL/h | INTRAMUSCULAR | Status: DC | PRN
  Administered 2015-06-13: 14 mL/h via EPIDURAL
  Filled 2015-06-13: qty 125

## 2015-06-13 MED ORDER — ACETAMINOPHEN 325 MG PO TABS: 650.0000 mg | ORAL_TABLET | ORAL | Status: DC | PRN

## 2015-06-13 MED ORDER — LACTATED RINGERS IV SOLN
INTRAVENOUS | Status: DC
  Administered 2015-06-13 (×3): via INTRAVENOUS

## 2015-06-13 MED ORDER — PHENYLEPHRINE 40 MCG/ML (10ML) SYRINGE FOR IV PUSH (FOR BLOOD PRESSURE SUPPORT)
80.0000 ug | PREFILLED_SYRINGE | INTRAVENOUS | Status: DC | PRN
  Filled 2015-06-13: qty 20

## 2015-06-13 MED ORDER — OXYTOCIN BOLUS FROM INFUSION: 500.0000 mL | INTRAVENOUS | Status: DC

## 2015-06-13 MED ORDER — FENTANYL 2.5 MCG/ML BUPIVACAINE 1/10 % EPIDURAL INFUSION (WH - ANES)
16.0000 mL/h | INTRAMUSCULAR | Status: DC | PRN
  Administered 2015-06-13 (×2): 16 mL/h via EPIDURAL
  Filled 2015-06-13: qty 125

## 2015-06-13 MED ORDER — WITCH HAZEL-GLYCERIN EX PADS: 1.0000 "application " | MEDICATED_PAD | CUTANEOUS | Status: DC | PRN

## 2015-06-13 MED ORDER — LACTATED RINGERS IV SOLN: 500.0000 mL | INTRAVENOUS | Status: DC | PRN

## 2015-06-13 MED ORDER — SIMETHICONE 80 MG PO CHEW: 80.0000 mg | CHEWABLE_TABLET | ORAL | Status: DC | PRN

## 2015-06-13 MED ORDER — OXYCODONE-ACETAMINOPHEN 5-325 MG PO TABS
2.0000 | ORAL_TABLET | ORAL | Status: DC | PRN
  Administered 2015-06-13: 2 via ORAL
  Filled 2015-06-13: qty 2

## 2015-06-13 MED ORDER — OXYTOCIN 40 UNITS IN LACTATED RINGERS INFUSION - SIMPLE MED: 62.5000 mL/h | INTRAVENOUS | Status: DC

## 2015-06-13 MED ORDER — MEASLES, MUMPS & RUBELLA VAC ~~LOC~~ INJ: 0.5000 mL | INJECTION | Freq: Once | SUBCUTANEOUS | Status: DC

## 2015-06-13 MED ORDER — TERBUTALINE SULFATE 1 MG/ML IJ SOLN: 0.2500 mg | Freq: Once | INTRAMUSCULAR | Status: DC | PRN

## 2015-06-13 MED ORDER — LIDOCAINE HCL (PF) 1 % IJ SOLN
30.0000 mL | INTRAMUSCULAR | Status: AC | PRN
  Administered 2015-06-13 (×2): 30 mL via SUBCUTANEOUS
  Filled 2015-06-13 (×2): qty 30

## 2015-06-13 MED ORDER — EPHEDRINE 5 MG/ML INJ: 10.0000 mg | INTRAVENOUS | Status: DC | PRN

## 2015-06-13 MED ORDER — OXYTOCIN 40 UNITS IN LACTATED RINGERS INFUSION - SIMPLE MED
1.0000 m[IU]/min | INTRAVENOUS | Status: DC
  Administered 2015-06-13: 10 m[IU]/min via INTRAVENOUS
  Administered 2015-06-13: 2 m[IU]/min via INTRAVENOUS
  Administered 2015-06-13: 8 m[IU]/min via INTRAVENOUS
  Filled 2015-06-13: qty 1000

## 2015-06-13 MED ORDER — OXYCODONE-ACETAMINOPHEN 5-325 MG PO TABS: 1.0000 | ORAL_TABLET | ORAL | Status: DC | PRN

## 2015-06-13 MED ORDER — IBUPROFEN 600 MG PO TABS
600.0000 mg | ORAL_TABLET | Freq: Four times a day (QID) | ORAL | Status: DC
  Administered 2015-06-14 – 2015-06-15 (×6): 600 mg via ORAL
  Filled 2015-06-13 (×6): qty 1

## 2015-06-13 MED ORDER — ZOLPIDEM TARTRATE 5 MG PO TABS: 5.0000 mg | ORAL_TABLET | Freq: Every evening | ORAL | Status: DC | PRN

## 2015-06-13 MED ORDER — TETANUS-DIPHTH-ACELL PERTUSSIS 5-2.5-18.5 LF-MCG/0.5 IM SUSP: 0.5000 mL | Freq: Once | INTRAMUSCULAR | Status: DC

## 2015-06-13 MED ORDER — BUTORPHANOL TARTRATE 1 MG/ML IJ SOLN
2.0000 mg | INTRAMUSCULAR | Status: DC | PRN
  Administered 2015-06-13 (×2): 2 mg via INTRAVENOUS
  Filled 2015-06-13 (×2): qty 2

## 2015-06-13 MED ORDER — SENNOSIDES-DOCUSATE SODIUM 8.6-50 MG PO TABS
2.0000 | ORAL_TABLET | ORAL | Status: DC
  Administered 2015-06-14 (×2): 2 via ORAL
  Filled 2015-06-13 (×2): qty 2

## 2015-06-13 MED ORDER — ONDANSETRON HCL 4 MG/2ML IJ SOLN: 4.0000 mg | INTRAMUSCULAR | Status: DC | PRN

## 2015-06-13 MED ORDER — BENZOCAINE-MENTHOL 20-0.5 % EX AERO
1.0000 "application " | INHALATION_SPRAY | CUTANEOUS | Status: DC | PRN
  Filled 2015-06-13: qty 56

## 2015-06-13 MED ORDER — OXYCODONE-ACETAMINOPHEN 5-325 MG PO TABS
2.0000 | ORAL_TABLET | ORAL | Status: DC | PRN
  Administered 2015-06-14 – 2015-06-15 (×6): 2 via ORAL
  Filled 2015-06-13 (×6): qty 2

## 2015-06-13 MED ORDER — ONDANSETRON HCL 4 MG/2ML IJ SOLN: 4.0000 mg | Freq: Four times a day (QID) | INTRAMUSCULAR | Status: DC | PRN

## 2015-06-13 MED ORDER — DIPHENHYDRAMINE HCL 50 MG/ML IJ SOLN: 12.5000 mg | INTRAMUSCULAR | Status: DC | PRN

## 2015-06-13 MED ORDER — LIDOCAINE HCL (PF) 1 % IJ SOLN
INTRAMUSCULAR | Status: DC | PRN
  Administered 2015-06-13: 4 mL
  Administered 2015-06-13: 6 mL via EPIDURAL

## 2015-06-13 MED ORDER — BUPIVACAINE HCL (PF) 0.25 % IJ SOLN
INTRAMUSCULAR | Status: DC | PRN
  Administered 2015-06-13: 3 mL

## 2015-06-13 MED ORDER — SODIUM BICARBONATE 8.4 % IV SOLN
INTRAVENOUS | Status: DC | PRN
  Administered 2015-06-13: 3 mL via EPIDURAL

## 2015-06-13 MED ORDER — DIBUCAINE 1 % RE OINT
1.0000 "application " | TOPICAL_OINTMENT | RECTAL | Status: DC | PRN
  Filled 2015-06-13: qty 28

## 2015-06-13 MED ORDER — ONDANSETRON HCL 4 MG PO TABS: 4.0000 mg | ORAL_TABLET | ORAL | Status: DC | PRN

## 2015-06-13 MED ORDER — CITRIC ACID-SODIUM CITRATE 334-500 MG/5ML PO SOLN: 30.0000 mL | ORAL | Status: DC | PRN

## 2015-06-13 NOTE — Anesthesia Preprocedure Evaluation (Signed)
Anesthesia Evaluation  Patient identified by MRN, date of birth, ID band Patient awake    Reviewed: Allergy & Precautions, H&P , Patient's Chart, lab work & pertinent test results  Airway Mallampati: II  TM Distance: >3 FB Neck ROM: full    Dental  (+) Teeth Intact   Pulmonary  breath sounds clear to auscultation        Cardiovascular Rhythm:regular Rate:Normal     Neuro/Psych    GI/Hepatic   Endo/Other    Renal/GU      Musculoskeletal   Abdominal   Peds  Hematology   Anesthesia Other Findings Back pain      Post traumatic stress disorder (PTSD)       Headache    GERD   Asthma             Reproductive/Obstetrics (+) Pregnancy                             Anesthesia Physical Anesthesia Plan  ASA: II  Anesthesia Plan: Epidural   Post-op Pain Management:    Induction:   Airway Management Planned:   Additional Equipment:   Intra-op Plan:   Post-operative Plan:   Informed Consent: I have reviewed the patients History and Physical, chart, labs and discussed the procedure including the risks, benefits and alternatives for the proposed anesthesia with the patient or authorized representative who has indicated his/her understanding and acceptance.   Dental Advisory Given  Plan Discussed with:   Anesthesia Plan Comments: (Labs checked- platelets confirmed with RN in room. Fetal heart tracing, per RN, reported to be stable enough for sitting procedure. Discussed epidural, and patient consents to the procedure:  included risk of possible headache,backache, failed block, allergic reaction, and nerve injury. This patient was asked if she had any questions or concerns before the procedure started.)        Anesthesia Quick Evaluation

## 2015-06-13 NOTE — Progress Notes (Signed)
Patient taken to nursery via wheelchair to breastfeed baby. Report called to Hart Carwin, RN. Shanda Bumps, RN nursery nurse made aware.

## 2015-06-13 NOTE — Consult Note (Signed)
Neonatology Note:  Attendance at Code Apgar:   Our team responded to a Code Apgar call to room # 166 following NSVD,immediately following delivery complicated by 20 second shoulder dystocia. The requesting physician was Dr. Dareen Piano. The mother is a [id number] A pos, GBS neg with no risk factors for infection. ROM occurred 13 hours PTD and the fluid was clear. There had been no distress prior to delivery, and mother had not gotten any Magnesium or narcotic analgesics during labor.  At delivery, the baby was floppy, blue, and with decreased respiratory effort, but HR > 100. The OB nursing staff in attendance gave vigorous stimulation. Our team arrived at 1.5 minutes of life, at which time the baby was breathing, with HR > 100. We bulb suctioned for some thick mucous and, despite this, he continued to be "snorty". He cried vigorously and his lungs were completely clear to auscultation. He had mild subcostal retractions for the first 5 minutes that resolved. His O2 saturation was within normal parameters during the first 8-10 minutes, then began to fall into the low 80s and 70s, associated with sounds of nasal congestion. We could not get his nose clear with the bulb and did not want to deep suction his nares. We gave BBO2 for several minutes and tried him skin to skin with his mother, after which the nasal congestion seemed to abate, but he still could not consistently maintain his O2 saturations above 90% in room air. Ap 4/8/9.  I spoke with the parents in the DR, then we transported the baby to the CN to complete transition and transferred the baby to the Pediatrician's care.   Doretha Sou, MD

## 2015-06-13 NOTE — Anesthesia Procedure Notes (Signed)

## 2015-06-13 NOTE — H&P (Signed)
Pt is a 37 ylo white female, [id number] at term who presents for a elective induction for pelvic pressure. PNLs wnl PMHX: see hollister [id number]       HEENT-wnl       ABD-gravid       FHTs-reactive IMP/ IUP at term         Pelvic pain Plan/Admit

## 2015-06-14 NOTE — Lactation Note (Signed)
This note was copied from the chart of Jenna Trevino. Lactation Consultation Note; Experienced BF mom reports baby is latching well with no pain. Reports he is doing better than her others. Baby asleep in mom's arms- reports she has just tried to nurse and he is too sleepy. Reviewed feeding cues and encouraged to feed whenever she sees them. Bf brochure given with resources for support after DC. No questions at present. To call for assist prn.  Patient Name: Jenna Mane Consolo [id number] Date: 06/14/2015 Reason for consult: Initial assessment   Maternal Data Formula Feeding for Exclusion: No Does the patient have breastfeeding experience prior to this delivery?: Yes  Feeding    LATCH Score/Interventions                      Lactation Tools Discussed/Used     Consult Status Consult Status: PRN    Pamelia Hoit 06/14/2015, 1:22 PM

## 2015-06-14 NOTE — Progress Notes (Signed)
Patient is doing well.  She is ambulating, voiding, tolerating PO.  Pain control is good.  Lochia is appropriate  Filed Vitals:   06/13/15 2304 06/14/15 0000 06/14/15 0110 06/14/15 0500  BP: 122/87 145/83 123/87 121/82  Pulse: 94 84 90 91  Temp:  98.6 F (37 C) 98.6 F (37 C) 98.7 F (37.1 C)  TempSrc:  Oral Oral Oral  Resp:  20 20 20   Height:      Weight:      SpO2:  98% 98% 99%    NAD Fundus firm Ext: 1+ edema b/l  Lab Results  Component Value Date   WBC 12.8* 06/13/2015   HGB 12.4 06/13/2015   HCT 35.5* 06/13/2015   MCV 95.2 06/13/2015   PLT 212 06/13/2015    --/--/A POS, A POS (08/04 0810)/[employer]  A/P 34 y.o. [birth year]2005 PPD#1 s/p TSVD. Routine care.   Expect d/c tomorrow. Circ as outpatient    [facility] GEFFEL Osprey

## 2015-06-14 NOTE — Anesthesia Postprocedure Evaluation (Signed)
  Anesthesia Post-op Note  Patient: Jenna Trevino  Procedure(s) Performed: * No procedures listed *  Patient Location: PACU and Mother/Baby  Anesthesia Type:Epidural  Level of Consciousness: awake, alert  and oriented  Airway and Oxygen Therapy: Patient Spontanous Breathing  Post-op Pain: mild  Post-op Assessment: Patient's Cardiovascular Status Stable, Respiratory Function Stable, Patent Airway, No signs of Nausea or vomiting, Pain level controlled, No headache, No backache and Patient able to bend at knees              Post-op Vital Signs: Reviewed and stable  Last Vitals:  Filed Vitals:   06/14/15 0500  BP: 121/82  Pulse: 91  Temp: 37.1 C  Resp: 20    Complications: No apparent anesthesia complications
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
## 2015-06-15 MED ORDER — OXYCODONE-ACETAMINOPHEN 5-325 MG PO TABS: 1.0000 | ORAL_TABLET | Freq: Four times a day (QID) | ORAL | Status: DC | PRN

## 2015-06-15 MED ORDER — IBUPROFEN 600 MG PO TABS: 600.0000 mg | ORAL_TABLET | Freq: Four times a day (QID) | ORAL | Status: DC

## 2015-06-15 NOTE — Discharge Instructions (Signed)

## 2015-06-15 NOTE — Lactation Note (Signed)
This note was copied from the chart of Jenna Aaron Boeh. Lactation Consultation Note  Patient Name: Jenna Trevino [id number] Date: 06/15/2015 Reason for consult: Follow-up assessment Baby 39 hours old. Mom is an experienced BF, and reports that this baby is nursing well and often. Mom states that she did have to supplement second baby for a few weeks. Discussed ways of knowing baby getting enough at breast. Mom aware of OP/BFSG and LC phone line assistance after D/C.  Maternal Data    Feeding Length of feed: 60 min  LATCH Score/Interventions                      Lactation Tools Discussed/Used     Consult Status Consult Status: PRN    Geralynn Ochs 06/15/2015, 11:53 AM

## 2015-06-15 NOTE — Discharge Summary (Signed)
Obstetric Discharge Summary Reason for Admission: induction of labor Prenatal Procedures: none Intrapartum Procedures: spontaneous vaginal delivery Postpartum Procedures: none Complications-Operative and Postpartum: shoulder dystocia HEMOGLOBIN  Date Value Ref Range Status  06/13/2015 12.4 12.0 - 15.0 g/dL Final   HCT  Date Value Ref Range Status  06/13/2015 35.5* 36.0 - 46.0 % Final    Physical Exam:  General: alert and cooperative Lochia: appropriate Uterine Fundus: firm DVT Evaluation: No evidence of DVT seen on physical exam. Negative Homan's sign.  Discharge Diagnoses: Term Pregnancy-delivered  Discharge Information: Date: 06/15/2015 Activity: pelvic rest Diet: routine Medications: PNV, Ibuprofen, Colace and Percocet Condition: stable Instructions: refer to practice specific booklet Discharge to: home Follow-up Information    Follow up with Levi Aland, MD In 4 weeks.   Specialty:  Obstetrics and Gynecology   Contact information:   [address] [place] [address] [phone]       Newborn Data: Live born female  Birth Weight: 9 lb 5.7 oz (4245 g) APGAR: 4, 8  Home with mother.  Jenna Trevino Jeet Shough 06/15/2015, 9:23 AM

## 2015-06-15 NOTE — Progress Notes (Signed)
Patient is doing well.  She is ambulating, voiding, tolerating PO.  Pain control is good.  Lochia is appropriate  Filed Vitals:   06/14/15 0110 06/14/15 0500 06/14/15 1820 06/15/15 0544  BP: 123/87 121/82 139/83 140/81  Pulse: 90 91 77 66  Temp: 98.6 F (37 C) 98.7 F (37.1 C) 98.4 F (36.9 C) 98.2 F (36.8 C)  TempSrc: Oral Oral Oral Oral  Resp: 20 20 20 18   Height:      Weight:      SpO2: 98% 99%      NAD Fundus firm Ext: 1+ edema b/l  Lab Results  Component Value Date   WBC 12.8* 06/13/2015   HGB 12.4 06/13/2015   HCT 35.5* 06/13/2015   MCV 95.2 06/13/2015   PLT 212 06/13/2015    --/--/A POS, A POS (08/04 0810)/RImmune  A/P 34 y.o. 01-29-2005 PPD#2 s/p TSVD. Routine care.   Meeting all goals, d/c today Circ as outpatient    [facility] GEFFEL [place]

## 2015-11-21 ENCOUNTER — Other Ambulatory Visit (HOSPITAL_COMMUNITY): Payer: Self-pay | Admitting: Neurosurgery

## 2015-12-06 NOTE — Pre-Procedure Instructions (Signed)
Jenna Trevino  12/06/2015      [facility] - [place] - [address] [place] [address] Phone: [phone] Fax: [phone]  [facility] [address] [place], [place] - [address] AT [facility] [address] [place] [address] Phone: [phone] Fax: [phone]  [id number] - [place], [place] - [address] [place] [address] AT [facility] [address] [place] [address] [place] [address] Phone: [phone] Fax: [phone]    Your procedure is scheduled on Tues, Feb 7 @ 8:00 AM  Report to [facility] Admitting at 6:00 AM  Call this number if you have problems the morning of surgery:  [phone]   Remember:  Do not eat food or drink liquids after midnight.  Take these medicines the morning of surgery with A SIP OF WATER Albuterol<Bring Your Inhaler With You> and Pain Pill(if needed)              Stop taking your Ibuprofen.No Goody's,BC's,Aleve,Aspirin,Ibuprofen,Motrin,Advil,Fish Oil,or any Herbal Medications.    Do not wear jewelry, make-up or nail polish.  Do not wear lotions, powders, or perfumes.  You may wear deodorant.  Do not shave 48 hours prior to surgery.    Do not bring valuables to the hospital.  [facility] is not responsible for any belongings or valuables.  Contacts, dentures or bridgework may not be worn into surgery.  Leave your suitcase in the car.  After surgery it may be brought to your room.  For patients admitted to the hospital, discharge time will be determined by your treatment team.  Patients discharged the day of surgery will not be allowed to drive home.    Special instructions:  [place] - Preparing for Surgery  Before surgery, you can play an important role.  Because skin is not sterile, your skin needs to be as free of germs as possible.  You can reduce the number of germs on you skin by washing with CHG (chlorahexidine  gluconate) soap before surgery.  CHG is an antiseptic cleaner which kills germs and bonds with the skin to continue killing germs even after washing.  Please DO NOT use if you have an allergy to CHG or antibacterial soaps.  If your skin becomes reddened/irritated stop using the CHG and inform your nurse when you arrive at Short Stay.  Do not shave (including legs and underarms) for at least 48 hours prior to the first CHG shower.  You may shave your face.  Please follow these instructions carefully:   1.  Shower with CHG Soap the night before surgery and the                                morning of Surgery.  2.  If you choose to wash your hair, wash your hair first as usual with your       normal shampoo.  3.  After you shampoo, rinse your hair and body thoroughly to remove the                      Shampoo.  4.  Use CHG as you would any other liquid soap.  You can apply chg directly       to the skin and wash gently with scrungie or a clean washcloth.  5.  Apply the CHG Soap to your body  ONLY FROM THE NECK DOWN.        Do not use on open wounds or open sores.  Avoid contact with your eyes,       ears, mouth and genitals (private parts).  Wash genitals (private parts)       with your normal soap.  6.  Wash thoroughly, paying special attention to the area where your surgery        will be performed.  7.  Thoroughly rinse your body with warm water from the neck down.  8.  DO NOT shower/wash with your normal soap after using and rinsing off       the CHG Soap.  9.  Pat yourself dry with a clean towel.            10.  Wear clean pajamas.            11.  Place clean sheets on your bed the night of your first shower and do not        sleep with pets.  Day of Surgery  Do not apply any lotions/deoderants the morning of surgery.  Please wear clean clothes to the hospital/surgery center.    Please read over the following fact sheets that you were given. Pain Booklet, Coughing and Deep Breathing,  Blood Transfusion Information, Surgical Site Infection Prevention and Anesthesia Post-op Instructions

## 2015-12-09 ENCOUNTER — Ambulatory Visit (HOSPITAL_COMMUNITY)
Admission: RE | Admit: 2015-12-09 | Discharge: 2015-12-09 | Disposition: A | Discharge status: Home / Self Care | Payer: Medicaid Other | Source: Ambulatory Visit | Attending: Neurosurgery | Admitting: Neurosurgery

## 2015-12-09 ENCOUNTER — Encounter (HOSPITAL_COMMUNITY): Payer: Self-pay

## 2015-12-09 ENCOUNTER — Encounter (HOSPITAL_COMMUNITY)
Admission: RE | Admit: 2015-12-09 | Discharge: 2015-12-09 | Disposition: A | Discharge status: Home / Self Care | Payer: Medicaid Other | Source: Ambulatory Visit | Attending: Neurosurgery | Admitting: Neurosurgery

## 2015-12-09 DIAGNOSIS — R9431 Abnormal electrocardiogram [ECG] [EKG]: Secondary | ICD-10-CM | POA: Diagnosis not present

## 2015-12-09 DIAGNOSIS — M4310 Spondylolisthesis, site unspecified: Secondary | ICD-10-CM

## 2015-12-09 DIAGNOSIS — J9811 Atelectasis: Secondary | ICD-10-CM | POA: Diagnosis not present

## 2015-12-09 DIAGNOSIS — Z01812 Encounter for preprocedural laboratory examination: Secondary | ICD-10-CM | POA: Insufficient documentation

## 2015-12-09 DIAGNOSIS — Z01810 Encounter for preprocedural cardiovascular examination: Secondary | ICD-10-CM | POA: Insufficient documentation

## 2015-12-09 DIAGNOSIS — R001 Bradycardia, unspecified: Secondary | ICD-10-CM | POA: Insufficient documentation

## 2015-12-09 DIAGNOSIS — I517 Cardiomegaly: Secondary | ICD-10-CM | POA: Insufficient documentation

## 2015-12-09 DIAGNOSIS — J45909 Unspecified asthma, uncomplicated: Secondary | ICD-10-CM | POA: Diagnosis not present

## 2015-12-09 DIAGNOSIS — Z01818 Encounter for other preprocedural examination: Secondary | ICD-10-CM | POA: Diagnosis present

## 2015-12-09 HISTORY — DX: Pain in unspecified joint: M25.50

## 2015-12-09 HISTORY — DX: Diverticulosis of intestine, part unspecified, without perforation or abscess without bleeding: K57.90

## 2015-12-09 HISTORY — DX: Dorsalgia, unspecified: M54.9

## 2015-12-09 HISTORY — DX: Carpal tunnel syndrome, unspecified upper limb: G56.00

## 2015-12-09 HISTORY — DX: Unspecified osteoarthritis, unspecified site: M19.90

## 2015-12-09 HISTORY — DX: Panic disorder (episodic paroxysmal anxiety): F41.0

## 2015-12-09 HISTORY — DX: Other chronic pain: G89.29

## 2015-12-09 HISTORY — DX: Personal history of other diseases of the nervous system and sense organs: Z86.69

## 2015-12-09 HISTORY — DX: Insomnia, unspecified: G47.00

## 2015-12-09 HISTORY — DX: Depression, unspecified: F32.A

## 2015-12-09 HISTORY — DX: Major depressive disorder, single episode, unspecified: F32.9

## 2015-12-09 LAB — CBC WITH DIFFERENTIAL/PLATELET
BASOS ABS: 0 10*3/uL (ref 0.0–0.1)
BASOS PCT: 0 %
EOS ABS: 0.1 10*3/uL (ref 0.0–0.7)
Eosinophils Relative: 2 %
HEMATOCRIT: 42.3 % (ref 36.0–46.0)
HEMOGLOBIN: 14.7 g/dL (ref 12.0–15.0)
Lymphocytes Relative: 39 %
Lymphs Abs: 2.8 10*3/uL (ref 0.7–4.0)
MCH: 31.3 pg (ref 26.0–34.0)
MCHC: 34.8 g/dL (ref 30.0–36.0)
MCV: 90 fL (ref 78.0–100.0)
Monocytes Absolute: 0.3 10*3/uL (ref 0.1–1.0)
Monocytes Relative: 4 %
NEUTROS ABS: 3.9 10*3/uL (ref 1.7–7.7)
NEUTROS PCT: 55 %
Platelets: 217 10*3/uL (ref 150–400)
RBC: 4.7 MIL/uL (ref 3.87–5.11)
RDW: 13.7 % (ref 11.5–15.5)
WBC: 7.2 10*3/uL (ref 4.0–10.5)

## 2015-12-09 LAB — TYPE AND SCREEN
ABO/RH(D): A POS
ANTIBODY SCREEN: NEGATIVE

## 2015-12-09 LAB — BASIC METABOLIC PANEL
Anion gap: 12 (ref 5–15)
BUN: 15 mg/dL (ref 6–20)
CALCIUM: 9.7 mg/dL (ref 8.9–10.3)
CHLORIDE: 105 mmol/L (ref 101–111)
CO2: 24 mmol/L (ref 22–32)
CREATININE: 0.81 mg/dL (ref 0.44–1.00)
GFR calc Af Amer: >60 mL/min (ref >60)
GFR calc non Af Amer: >60 mL/min (ref >60)
Glucose, Bld: 86 mg/dL (ref 65–99)
Potassium: 4.2 mmol/L (ref 3.5–5.1)
SODIUM: 141 mmol/L (ref 135–145)

## 2015-12-09 LAB — HCG, SERUM, QUALITATIVE: PREG SERUM: NEGATIVE

## 2015-12-09 LAB — ABO/RH: ABO/RH(D): A POS

## 2015-12-09 LAB — SURGICAL PCR SCREEN
MRSA, PCR: POSITIVE — AB
STAPHYLOCOCCUS AUREUS: POSITIVE — AB

## 2015-12-09 NOTE — Progress Notes (Signed)
Cardiologist denies  Sees Lonie Peak PA-C  Echo at age 35  Stress test denies  Heart cath denies   EKG denies in past yr  CXR denies in past yr

## 2015-12-09 NOTE — Progress Notes (Signed)
[facility] [place]

## 2015-12-10 NOTE — Progress Notes (Signed)
Anesthesia Chart Review:  Pt is a 35 year old female scheduled for L4-5, L5-S1 PLIF on 12/17/2015 with Dr. Jordan Likes.   PCP is Lonie Peak, PA  PMH includes:  Asthma, anemia, OCD. Never smoker. BMI 28.   Medications include: albuterol  Preoperative labs reviewed.    Chest x-ray reviewed. Mild left base subsegmental atelectasis, otherwise negative chest.  EKG 12/09/15: Sinus bradycardia (58 bpm). Possible Left atrial enlargement. Cannot rule out Anterior infarct, age undetermined  If no changes, I anticipate pt can proceed with surgery as scheduled.   Rica Mast, FNP-BC [facility] Short Stay Surgical Center/Anesthesiology Phone: [phone] 12/10/2015 4:42 PM

## 2015-12-16 MED ORDER — DEXAMETHASONE SODIUM PHOSPHATE 10 MG/ML IJ SOLN
10.0000 mg | INTRAMUSCULAR | Status: DC
  Filled 2015-12-16: qty 1

## 2015-12-16 MED ORDER — CEFAZOLIN SODIUM-DEXTROSE 2-3 GM-% IV SOLR
2.0000 g | INTRAVENOUS | Status: AC
  Administered 2015-12-17: 2 g via INTRAVENOUS
  Filled 2015-12-16: qty 50

## 2015-12-17 ENCOUNTER — Encounter (HOSPITAL_COMMUNITY)
Admission: RE | Disposition: A | Discharge status: Home / Self Care | Payer: Self-pay | Source: Ambulatory Visit | Attending: Neurosurgery

## 2015-12-17 ENCOUNTER — Encounter (HOSPITAL_COMMUNITY): Payer: Self-pay | Admitting: Certified Registered Nurse Anesthetist

## 2015-12-17 ENCOUNTER — Inpatient Hospital Stay (HOSPITAL_COMMUNITY): Payer: Medicaid Other

## 2015-12-17 ENCOUNTER — Inpatient Hospital Stay (HOSPITAL_COMMUNITY): Payer: Medicaid Other | Admitting: Certified Registered Nurse Anesthetist

## 2015-12-17 ENCOUNTER — Inpatient Hospital Stay (HOSPITAL_COMMUNITY): Payer: Medicaid Other | Admitting: Emergency Medicine

## 2015-12-17 ENCOUNTER — Inpatient Hospital Stay (HOSPITAL_COMMUNITY)
Admission: RE | Admit: 2015-12-17 | Discharge: 2015-12-18 | DRG: 460 | Disposition: A | Discharge status: Home / Self Care | Payer: Medicaid Other | Source: Ambulatory Visit | Attending: Neurosurgery | Admitting: Neurosurgery

## 2015-12-17 DIAGNOSIS — M4317 Spondylolisthesis, lumbosacral region: Secondary | ICD-10-CM | POA: Diagnosis present

## 2015-12-17 DIAGNOSIS — Z96652 Presence of left artificial knee joint: Secondary | ICD-10-CM | POA: Diagnosis present

## 2015-12-17 DIAGNOSIS — M4806 Spinal stenosis, lumbar region: Principal | ICD-10-CM | POA: Diagnosis present

## 2015-12-17 DIAGNOSIS — Z419 Encounter for procedure for purposes other than remedying health state, unspecified: Secondary | ICD-10-CM

## 2015-12-17 DIAGNOSIS — M5126 Other intervertebral disc displacement, lumbar region: Secondary | ICD-10-CM | POA: Diagnosis present

## 2015-12-17 DIAGNOSIS — J45909 Unspecified asthma, uncomplicated: Secondary | ICD-10-CM | POA: Diagnosis present

## 2015-12-17 DIAGNOSIS — K219 Gastro-esophageal reflux disease without esophagitis: Secondary | ICD-10-CM | POA: Diagnosis present

## 2015-12-17 DIAGNOSIS — F429 Obsessive-compulsive disorder, unspecified: Secondary | ICD-10-CM | POA: Diagnosis present

## 2015-12-17 DIAGNOSIS — M4310 Spondylolisthesis, site unspecified: Secondary | ICD-10-CM

## 2015-12-17 DIAGNOSIS — M549 Dorsalgia, unspecified: Secondary | ICD-10-CM | POA: Diagnosis present

## 2015-12-17 DIAGNOSIS — F410 Panic disorder [episodic paroxysmal anxiety] without agoraphobia: Secondary | ICD-10-CM | POA: Diagnosis present

## 2015-12-17 SURGERY — POSTERIOR LUMBAR FUSION 2 LEVEL
Anesthesia: General | Site: Back

## 2015-12-17 MED ORDER — MIDAZOLAM HCL 2 MG/2ML IJ SOLN
INTRAMUSCULAR | Status: AC
  Administered 2015-12-17: 2 mg via INTRAVENOUS
  Filled 2015-12-17: qty 2

## 2015-12-17 MED ORDER — MIDAZOLAM HCL 2 MG/2ML IJ SOLN
0.5000 mg | Freq: Once | INTRAMUSCULAR | Status: AC
  Administered 2015-12-17: 2 mg via INTRAVENOUS

## 2015-12-17 MED ORDER — EPHEDRINE SULFATE 50 MG/ML IJ SOLN
INTRAMUSCULAR | Status: AC
  Filled 2015-12-17: qty 1

## 2015-12-17 MED ORDER — OXYCODONE-ACETAMINOPHEN 5-325 MG PO TABS
ORAL_TABLET | ORAL | Status: AC
  Administered 2015-12-17: 2 via ORAL
  Filled 2015-12-17: qty 2

## 2015-12-17 MED ORDER — NEOSTIGMINE METHYLSULFATE 10 MG/10ML IV SOLN
INTRAVENOUS | Status: AC
  Filled 2015-12-17: qty 1

## 2015-12-17 MED ORDER — ALBUMIN HUMAN 5 % IV SOLN
INTRAVENOUS | Status: DC | PRN
  Administered 2015-12-17: 10:00:00 via INTRAVENOUS

## 2015-12-17 MED ORDER — MIDAZOLAM HCL 5 MG/5ML IJ SOLN
INTRAMUSCULAR | Status: DC | PRN
  Administered 2015-12-17: 2 mg via INTRAVENOUS

## 2015-12-17 MED ORDER — PROPOFOL 10 MG/ML IV BOLUS
INTRAVENOUS | Status: AC
  Filled 2015-12-17: qty 20

## 2015-12-17 MED ORDER — MEPERIDINE HCL 25 MG/ML IJ SOLN: 6.2500 mg | INTRAMUSCULAR | Status: DC | PRN

## 2015-12-17 MED ORDER — DIAZEPAM 5 MG PO TABS
ORAL_TABLET | ORAL | Status: AC
  Filled 2015-12-17: qty 2

## 2015-12-17 MED ORDER — SODIUM CHLORIDE 0.9% FLUSH: 3.0000 mL | INTRAVENOUS | Status: DC | PRN

## 2015-12-17 MED ORDER — DEXMEDETOMIDINE HCL IN NACL 200 MCG/50ML IV SOLN
INTRAVENOUS | Status: DC | PRN
Start: 2015-12-17 — End: 2015-12-17
  Administered 2015-12-17 (×8): 8 ug via INTRAVENOUS

## 2015-12-17 MED ORDER — CHLORHEXIDINE GLUCONATE CLOTH 2 % EX PADS
6.0000 | MEDICATED_PAD | Freq: Every day | CUTANEOUS | Status: DC
  Administered 2015-12-18: 6 via TOPICAL

## 2015-12-17 MED ORDER — VANCOMYCIN HCL 1000 MG IV SOLR
INTRAVENOUS | Status: DC | PRN
  Administered 2015-12-17: 1000 mg

## 2015-12-17 MED ORDER — STERILE WATER FOR INJECTION IJ SOLN
INTRAMUSCULAR | Status: AC
  Filled 2015-12-17: qty 10

## 2015-12-17 MED ORDER — HYDROMORPHONE HCL 1 MG/ML IJ SOLN
0.5000 mg | INTRAMUSCULAR | Status: DC | PRN
  Administered 2015-12-17 (×2): 1 mg via INTRAVENOUS
  Filled 2015-12-17 (×2): qty 1

## 2015-12-17 MED ORDER — PROPOFOL 10 MG/ML IV BOLUS
INTRAVENOUS | Status: DC | PRN
  Administered 2015-12-17: 150 mg via INTRAVENOUS

## 2015-12-17 MED ORDER — ROCURONIUM BROMIDE 100 MG/10ML IV SOLN
INTRAVENOUS | Status: DC | PRN
  Administered 2015-12-17 (×2): 50 mg via INTRAVENOUS

## 2015-12-17 MED ORDER — HYDROMORPHONE HCL 1 MG/ML IJ SOLN
INTRAMUSCULAR | Status: AC
  Administered 2015-12-17: 0.5 mg via INTRAVENOUS
  Filled 2015-12-17: qty 1

## 2015-12-17 MED ORDER — HYDROCODONE-ACETAMINOPHEN 5-325 MG PO TABS: 1.0000 | ORAL_TABLET | ORAL | Status: DC | PRN

## 2015-12-17 MED ORDER — HYDROMORPHONE HCL 1 MG/ML IJ SOLN
0.2500 mg | INTRAMUSCULAR | Status: DC | PRN
  Administered 2015-12-17 (×4): 0.5 mg via INTRAVENOUS

## 2015-12-17 MED ORDER — 0.9 % SODIUM CHLORIDE (POUR BTL) OPTIME
TOPICAL | Status: DC | PRN
  Administered 2015-12-17: 1000 mL

## 2015-12-17 MED ORDER — FENTANYL CITRATE (PF) 100 MCG/2ML IJ SOLN
INTRAMUSCULAR | Status: DC | PRN
  Administered 2015-12-17: 50 ug via INTRAVENOUS
  Administered 2015-12-17 (×2): 100 ug via INTRAVENOUS
  Administered 2015-12-17 (×2): 50 ug via INTRAVENOUS
  Administered 2015-12-17: 100 ug via INTRAVENOUS
  Administered 2015-12-17: 50 ug via INTRAVENOUS

## 2015-12-17 MED ORDER — MIDAZOLAM HCL 2 MG/2ML IJ SOLN
INTRAMUSCULAR | Status: AC
  Filled 2015-12-17: qty 2

## 2015-12-17 MED ORDER — FENTANYL CITRATE (PF) 250 MCG/5ML IJ SOLN
INTRAMUSCULAR | Status: AC
  Filled 2015-12-17: qty 5

## 2015-12-17 MED ORDER — IBUPROFEN 200 MG PO TABS
600.0000 mg | ORAL_TABLET | Freq: Four times a day (QID) | ORAL | Status: DC | PRN
  Administered 2015-12-17: 600 mg via ORAL
  Filled 2015-12-17: qty 3

## 2015-12-17 MED ORDER — LACTATED RINGERS IV SOLN
INTRAVENOUS | Status: DC
  Administered 2015-12-17 (×3): via INTRAVENOUS

## 2015-12-17 MED ORDER — VANCOMYCIN HCL 1000 MG IV SOLR
INTRAVENOUS | Status: AC
  Filled 2015-12-17: qty 1000

## 2015-12-17 MED ORDER — ALBUTEROL SULFATE (2.5 MG/3ML) 0.083% IN NEBU: 3.0000 mL | INHALATION_SOLUTION | Freq: Four times a day (QID) | RESPIRATORY_TRACT | Status: DC | PRN

## 2015-12-17 MED ORDER — PHENOL 1.4 % MT LIQD: 1.0000 | OROMUCOSAL | Status: DC | PRN

## 2015-12-17 MED ORDER — ACETAMINOPHEN 650 MG RE SUPP: 650.0000 mg | RECTAL | Status: DC | PRN

## 2015-12-17 MED ORDER — ALBUTEROL SULFATE HFA 108 (90 BASE) MCG/ACT IN AERS
INHALATION_SPRAY | RESPIRATORY_TRACT | Status: DC | PRN
  Administered 2015-12-17: 6 via RESPIRATORY_TRACT

## 2015-12-17 MED ORDER — NEOSTIGMINE METHYLSULFATE 10 MG/10ML IV SOLN
INTRAVENOUS | Status: DC | PRN
  Administered 2015-12-17: 5 mg via INTRAVENOUS

## 2015-12-17 MED ORDER — ROCURONIUM BROMIDE 50 MG/5ML IV SOLN
INTRAVENOUS | Status: AC
  Filled 2015-12-17: qty 1

## 2015-12-17 MED ORDER — ONDANSETRON HCL 4 MG/2ML IJ SOLN
INTRAMUSCULAR | Status: DC | PRN
  Administered 2015-12-17: 4 mg via INTRAVENOUS

## 2015-12-17 MED ORDER — ACETAMINOPHEN 325 MG PO TABS: 650.0000 mg | ORAL_TABLET | ORAL | Status: DC | PRN

## 2015-12-17 MED ORDER — OXYCODONE-ACETAMINOPHEN 5-325 MG PO TABS
1.0000 | ORAL_TABLET | ORAL | Status: DC | PRN
  Administered 2015-12-17 – 2015-12-18 (×6): 2 via ORAL
  Filled 2015-12-17 (×5): qty 2

## 2015-12-17 MED ORDER — CEFAZOLIN SODIUM 1-5 GM-% IV SOLN
1.0000 g | Freq: Three times a day (TID) | INTRAVENOUS | Status: AC
  Administered 2015-12-17 – 2015-12-18 (×2): 1 g via INTRAVENOUS
  Filled 2015-12-17 (×2): qty 50

## 2015-12-17 MED ORDER — PROMETHAZINE HCL 25 MG/ML IJ SOLN: 25.0000 mg | Freq: Four times a day (QID) | INTRAMUSCULAR | Status: DC | PRN

## 2015-12-17 MED ORDER — DEXAMETHASONE SODIUM PHOSPHATE 4 MG/ML IJ SOLN
INTRAMUSCULAR | Status: DC | PRN
  Administered 2015-12-17: 10 mg via INTRAVENOUS

## 2015-12-17 MED ORDER — THROMBIN 20000 UNITS EX SOLR
CUTANEOUS | Status: DC | PRN
  Administered 2015-12-17 (×2): via TOPICAL

## 2015-12-17 MED ORDER — ALBUTEROL SULFATE HFA 108 (90 BASE) MCG/ACT IN AERS
INHALATION_SPRAY | RESPIRATORY_TRACT | Status: AC
Start: 2015-12-17 — End: 2015-12-17
  Filled 2015-12-17: qty 6.7

## 2015-12-17 MED ORDER — GLYCOPYRROLATE 0.2 MG/ML IJ SOLN
INTRAMUSCULAR | Status: DC | PRN
  Administered 2015-12-17: 1 mg via INTRAVENOUS

## 2015-12-17 MED ORDER — GLYCOPYRROLATE 0.2 MG/ML IJ SOLN
INTRAMUSCULAR | Status: AC
  Filled 2015-12-17: qty 5

## 2015-12-17 MED ORDER — LIDOCAINE HCL (CARDIAC) 20 MG/ML IV SOLN
INTRAVENOUS | Status: AC
  Filled 2015-12-17: qty 5

## 2015-12-17 MED ORDER — SUCCINYLCHOLINE CHLORIDE 20 MG/ML IJ SOLN
INTRAMUSCULAR | Status: AC
  Filled 2015-12-17: qty 1

## 2015-12-17 MED ORDER — ONDANSETRON HCL 4 MG/2ML IJ SOLN
4.0000 mg | INTRAMUSCULAR | Status: DC | PRN
  Administered 2015-12-17: 4 mg via INTRAVENOUS
  Filled 2015-12-17: qty 2

## 2015-12-17 MED ORDER — MENTHOL 3 MG MT LOZG: 1.0000 | LOZENGE | OROMUCOSAL | Status: DC | PRN

## 2015-12-17 MED ORDER — ROCURONIUM BROMIDE 50 MG/5ML IV SOLN
INTRAVENOUS | Status: AC
  Filled 2015-12-17: qty 2

## 2015-12-17 MED ORDER — ONDANSETRON HCL 4 MG/2ML IJ SOLN: 4.0000 mg | Freq: Once | INTRAMUSCULAR | Status: DC | PRN

## 2015-12-17 MED ORDER — SODIUM CHLORIDE 0.9 % IR SOLN
Status: DC | PRN
  Administered 2015-12-17: 08:00:00

## 2015-12-17 MED ORDER — SODIUM CHLORIDE 0.9% FLUSH
3.0000 mL | Freq: Two times a day (BID) | INTRAVENOUS | Status: DC
  Administered 2015-12-17 – 2015-12-18 (×2): 3 mL via INTRAVENOUS

## 2015-12-17 MED ORDER — LIDOCAINE HCL (CARDIAC) 20 MG/ML IV SOLN
INTRAVENOUS | Status: DC | PRN
  Administered 2015-12-17: 100 mg via INTRAVENOUS

## 2015-12-17 MED ORDER — DIAZEPAM 5 MG PO TABS
5.0000 mg | ORAL_TABLET | Freq: Four times a day (QID) | ORAL | Status: DC | PRN
  Administered 2015-12-17 – 2015-12-18 (×5): 10 mg via ORAL
  Filled 2015-12-17 (×4): qty 2

## 2015-12-17 SURGICAL SUPPLY — 63 items
APL SKNCLS STERI-STRIP NONHPOA (GAUZE/BANDAGES/DRESSINGS) ×1
BAG DECANTER FOR FLEXI CONT (MISCELLANEOUS) ×3 IMPLANT
BENZOIN TINCTURE PRP APPL 2/3 (GAUZE/BANDAGES/DRESSINGS) ×3 IMPLANT
BLADE CLIPPER SURG (BLADE) IMPLANT
BRUSH SCRUB EZ PLAIN DRY (MISCELLANEOUS) ×3 IMPLANT
BUR CUTTER 7.0 ROUND (BURR) ×3 IMPLANT
BUR MATCHSTICK NEURO 3.0 LAGG (BURR) ×3 IMPLANT
CANISTER SUCT 3000ML PPV (MISCELLANEOUS) ×3 IMPLANT
CAP LCK SPNE (Orthopedic Implant) ×6 IMPLANT
CAP LOCK SPINE RADIUS (Orthopedic Implant) IMPLANT
CAP LOCKING (Orthopedic Implant) ×18 IMPLANT
CLOSURE WOUND 1/2 X4 (GAUZE/BANDAGES/DRESSINGS) ×1
CONT SPEC 4OZ CLIKSEAL STRL BL (MISCELLANEOUS) ×3 IMPLANT
COVER BACK TABLE 60X90IN (DRAPES) ×3 IMPLANT
CROSSLINK VARIABLE M-A (Orthopedic Implant) ×2 IMPLANT
DECANTER SPIKE VIAL GLASS SM (MISCELLANEOUS) ×3 IMPLANT
DEVICE INTERBODY ELEVATE 23X10 (Cage) ×6 IMPLANT
DEVICE INTERBODY ELEVATE 9X23 (Cage) ×2 IMPLANT
DRAPE C-ARM 42X72 X-RAY (DRAPES) ×6 IMPLANT
DRAPE LAPAROTOMY 100X72X124 (DRAPES) ×3 IMPLANT
DRAPE POUCH INSTRU U-SHP 10X18 (DRAPES) ×3 IMPLANT
DRAPE PROXIMA HALF (DRAPES) IMPLANT
DRAPE SURG 17X23 STRL (DRAPES) ×12 IMPLANT
DRSG OPSITE POSTOP 4X6 (GAUZE/BANDAGES/DRESSINGS) ×2 IMPLANT
DURAPREP 26ML APPLICATOR (WOUND CARE) ×3 IMPLANT
ELECT REM PT RETURN 9FT ADLT (ELECTROSURGICAL) ×3
ELECTRODE REM PT RTRN 9FT ADLT (ELECTROSURGICAL) ×1 IMPLANT
EVACUATOR 1/8 PVC DRAIN (DRAIN) ×3 IMPLANT
GAUZE SPONGE 4X4 12PLY STRL (GAUZE/BANDAGES/DRESSINGS) ×3 IMPLANT
GAUZE SPONGE 4X4 16PLY XRAY LF (GAUZE/BANDAGES/DRESSINGS) IMPLANT
GLOVE ECLIPSE 9.0 STRL (GLOVE) ×6 IMPLANT
GLOVE EXAM NITRILE LRG STRL (GLOVE) IMPLANT
GLOVE INDICATOR 7.5 STRL GRN (GLOVE) ×10 IMPLANT
GLOVE SS N UNI LF 6.5 STRL (GLOVE) ×12 IMPLANT
GOWN STRL REUS W/ TWL LRG LVL3 (GOWN DISPOSABLE) IMPLANT
GOWN STRL REUS W/ TWL XL LVL3 (GOWN DISPOSABLE) ×2 IMPLANT
GOWN STRL REUS W/TWL 2XL LVL3 (GOWN DISPOSABLE) IMPLANT
GOWN STRL REUS W/TWL LRG LVL3 (GOWN DISPOSABLE)
GOWN STRL REUS W/TWL XL LVL3 (GOWN DISPOSABLE) ×6
KIT BASIN OR (CUSTOM PROCEDURE TRAY) ×3 IMPLANT
KIT ROOM TURNOVER OR (KITS) ×3 IMPLANT
LIQUID BAND (GAUZE/BANDAGES/DRESSINGS) ×3 IMPLANT
NEEDLE HYPO 22GX1.5 SAFETY (NEEDLE) ×3 IMPLANT
NS IRRIG 1000ML POUR BTL (IV SOLUTION) ×3 IMPLANT
PACK LAMINECTOMY NEURO (CUSTOM PROCEDURE TRAY) ×3 IMPLANT
PATTIES SURGICAL 1X1 (DISPOSABLE) ×2 IMPLANT
ROD 5.5X60MM PURPLE (Rod) IMPLANT
ROD 70MM (Rod) ×6 IMPLANT
ROD SPNL 70X5.5 NS TI RDS (Rod) IMPLANT
ROD SPNL 70X5.5XNS TI RDS (Rod) IMPLANT
SCREW 5.75 X 635 (Screw) ×4 IMPLANT
SCREW 5.75X40M (Screw) ×4 IMPLANT
SCREW 6.75X40MM (Screw) ×4 IMPLANT
SPONGE SURGIFOAM ABS GEL 100 (HEMOSTASIS) ×3 IMPLANT
STRIP CLOSURE SKIN 1/2X4 (GAUZE/BANDAGES/DRESSINGS) ×3 IMPLANT
SUT VIC AB 0 CT1 18XCR BRD8 (SUTURE) ×2 IMPLANT
SUT VIC AB 0 CT1 8-18 (SUTURE) ×6
SUT VIC AB 2-0 CT1 18 (SUTURE) ×5 IMPLANT
SUT VIC AB 3-0 SH 8-18 (SUTURE) ×6 IMPLANT
TOWEL OR 17X24 6PK STRL BLUE (TOWEL DISPOSABLE) ×3 IMPLANT
TOWEL OR 17X26 10 PK STRL BLUE (TOWEL DISPOSABLE) ×3 IMPLANT
TRAY FOLEY W/METER SILVER 14FR (SET/KITS/TRAYS/PACK) ×3 IMPLANT
WATER STERILE IRR 1000ML POUR (IV SOLUTION) ×3 IMPLANT

## 2015-12-17 NOTE — H&P (Signed)
Jenna Trevino is an 35 y.o. female.   Chief Complaint: Back pain HPI: 35 year old female with progressive back and bilateral lower extremity pain failing conservative management. Workup demonstrates evidence of a grade 1 L5-S1 lytic spondylolisthesis with severe foraminal stenosis and L4-5 disc degeneration with a broad-based central disc herniation and stenosis. Patient has failed conservative management and presents now for decompression and fusion.   Past Medical History  Diagnosis Date  . Obsessive compulsive disorder   . Asthma     inhaler  . History of migraine     last one on 12/07/15  . Carpal tunnel syndrome     bilateral  . Arthritis   . Joint pain   . Chronic back pain     spondylolisthesis.Scoliosis  . GERD (gastroesophageal reflux disease)     happens occasionally and will take OTC meds  . Diverticulosis   . Anemia     not on any meds  . Anxiety     doesn't take any meds  . Depression     doesn't take any meds  . Panic attacks     doesn't take any meds  . Insomnia     doesn't take any meds    Past Surgical History  Procedure Laterality Date  . Total knee arthroplasty Left   . Dilation and evacuation  2007  . Colonoscopy      Family History  Problem Relation Age of Onset  . ADD / ADHD Son   . Heart disease Mother   . Cancer Maternal Aunt   . Diabetes Maternal Aunt   . Cancer Maternal Uncle    Social History:  reports that she has never smoked. She has never used smokeless tobacco. She reports that she does not drink alcohol or use illicit drugs.  Allergies:  Allergies  Allergen Reactions  . Bee Venom Anaphylaxis  . Tobacco [Nicotiana Tabacum] Anaphylaxis    Medications Prior to Admission  Medication Sig Dispense Refill  . albuterol (PROVENTIL HFA;VENTOLIN HFA) 108 (90 BASE) MCG/ACT inhaler Inhale 2 puffs into the lungs every 6 (six) hours as needed for wheezing or shortness of breath.    Marland Kitchen ibuprofen (ADVIL,MOTRIN) 600 MG tablet Take 1 tablet  (600 mg total) by mouth every 6 (six) hours. (Patient taking differently: Take 600 mg by mouth every 6 (six) hours as needed for moderate pain. ) 40 tablet 0  . levonorgestrel (MIRENA) 20 MCG/24HR IUD 1 each by Intrauterine route continuous.    Marland Kitchen oxyCODONE-acetaminophen (PERCOCET/ROXICET) 5-325 MG per tablet Take 1 tablet by mouth every 6 (six) hours as needed for severe pain. 10 tablet 0  . Prenatal Vit-Fe Fumarate-FA (PRENATAL MULTIVITAMIN) TABS tablet Take 1 tablet by mouth daily at 12 noon.      No results found for this or any previous visit (from the past 48 hour(s)). No results found.  Pertinent items noted in HPI and remainder of comprehensive ROS otherwise negative.  Blood pressure 121/77, pulse 58, temperature 98 F (36.7 C), temperature source Oral, height 5\' 11"  (1.803 m), weight 91.173 kg (201 lb), SpO2 98 %, not currently breastfeeding.  The patient is awake and alert. She is oriented and appropriate. Cranial nerve function is intact. Speech is fluent. Judgment and insight are intact. Motor and sensory function extremities are normal. Back is moderately tender with increased lordosis. Straight leg raising is negative bilaterally. Chest and abdomen are benign. Extremities are free from injury deformity. 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 Plan bilateral L4-5 and L5-S1 decompression with foraminotomies followed by posterior lumbar interbody fusion utilizing interbody cages and local autografting coupled with posterior lateral arthrodesis utilizing segmental pedicle screw fixation and local autografting. Risks and benefits of been explained. Patient wishes to proceed.  Devorah Givhan A 12/17/2015, 7:42 AM

## 2015-12-17 NOTE — Brief Op Note (Signed)
12/17/2015  11:31 AM  PATIENT:  Jenna Trevino  35 y.o. female  PRE-OPERATIVE DIAGNOSIS:  Spondylolisthesis  POST-OPERATIVE DIAGNOSIS:  Spondylolisthesis  PROCEDURE:  Procedure(s): Posterior lumbar interbody fusion  lumbar four-five,lumbar five-sacral one (N/A)  SURGEON:  Surgeon(s) and Role:    * Julio Sicks, MD - Primary    * Loura Halt Ditty, MD - Assisting  PHYSICIAN ASSISTANT:   ASSISTANTS:    ANESTHESIA:   general  EBL:  Total I/O In: 1440 [I.V.:1000; Blood:190; IV Piggyback:250] Out: 785 [Urine:385; Blood:400]  BLOOD ADMINISTERED:none  DRAINS: none   LOCAL MEDICATIONS USED:  MARCAINE     SPECIMEN:  No Specimen  DISPOSITION OF SPECIMEN:  N/A  COUNTS:  YES  TOURNIQUET:  * No tourniquets in log *  DICTATION: .Dragon Dictation  PLAN OF CARE: Admit to inpatient   PATIENT DISPOSITION:  PACU - hemodynamically stable.   Delay start of Pharmacological VTE agent (>24hrs) due to surgical blood loss or risk of bleeding: yes

## 2015-12-17 NOTE — Anesthesia Procedure Notes (Signed)
Procedure Name: Intubation Date/Time: 12/17/2015 8:02 AM Performed by: Sarita Haver T Pre-anesthesia Checklist: Patient identified, Timeout performed, Emergency Drugs available, Suction available and Patient being monitored Patient Re-evaluated:Patient Re-evaluated prior to inductionOxygen Delivery Method: Circle system utilized and Simple face mask Preoxygenation: Pre-oxygenation with 100% oxygen Intubation Type: IV induction Ventilation: Mask ventilation without difficulty and Oral airway inserted - appropriate to patient size Laryngoscope Size: Miller and 3 Grade View: Grade I Tube type: Oral Tube size: 7.5 mm Number of attempts: 1 Airway Equipment and Method: Patient positioned with wedge pillow and Stylet Placement Confirmation: ETT inserted through vocal cords under direct vision,  positive ETCO2 and breath sounds checked- equal and bilateral Secured at: 22 cm Tube secured with: Tape Dental Injury: Teeth and Oropharynx as per pre-operative assessment

## 2015-12-17 NOTE — Progress Notes (Signed)
Agaitated, crying and moving about entire bed. Relates she has "problems with anxiet6y" for which she took ativan and clonapin prior to her pregnancy ( now 61mos postpartum and denies using same). Have talked with her about focusing on calming herself down ( working well esp-ecially is staff limits bedside contact and monitors from  Next door bay). Given po valium and is drinking sprite.

## 2015-12-17 NOTE — Anesthesia Preprocedure Evaluation (Addendum)
Anesthesia Evaluation  Patient identified by MRN, date of birth, ID band Patient awake    Reviewed: Allergy & Precautions, NPO status , Patient's Chart, lab work & pertinent test results  Airway Mallampati: I  TM Distance: >3 FB Neck ROM: Full    Dental  (+) Teeth Intact, Dental Advisory Given   Pulmonary    Pulmonary exam normal        Cardiovascular Normal cardiovascular exam     Neuro/Psych Anxiety Depression    GI/Hepatic GERD  Medicated and Controlled,  Endo/Other    Renal/GU      Musculoskeletal   Abdominal   Peds  Hematology   Anesthesia Other Findings   Reproductive/Obstetrics                            Anesthesia Physical Anesthesia Plan  ASA: II  Anesthesia Plan: General   Post-op Pain Management:    Induction: Intravenous  Airway Management Planned: Oral ETT  Additional Equipment:   Intra-op Plan:   Post-operative Plan: Extubation in OR  Informed Consent: I have reviewed the patients History and Physical, chart, labs and discussed the procedure including the risks, benefits and alternatives for the proposed anesthesia with the patient or authorized representative who has indicated his/her understanding and acceptance.     Plan Discussed with: CRNA and Surgeon  Anesthesia Plan Comments:         Anesthesia Quick Evaluation

## 2015-12-17 NOTE — Op Note (Signed)
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  Bilateral L4-5 decompressive laminotomies and foraminotomies, more than would be required for simple interbody fusion alone.  L4-5 and L5-S1 posterior lumbar interbody fusion utilizing interbody cages and local autograft  L4-5 S1 posterior lateral arthrodesis utilizing segmental pedicle screw fixation and local autograft.  Surgeon:Pantera Winterrowd A.Astella Desir, M.D.  Asst. Surgeon: Ditty   Anesthesia: General  Indication: 35 year old female with progressive severe lower back pain with radiation to both lower extremities failing conservative management. Workup demonstrates evidence of 2 significant problems. First and foremost the patient has evidence of a grade 1 lytic spondylolisthesis with severe foraminal stenosis bilaterally. Patient also has bilateral L4-5 stenosis secondary to a broad-based disc herniation and facet arthropathy. Remainder of her lumbar spine was healthy and normal.  Operative note: After induction of anesthesia, patient position prone on the Wilson frame an appropriate padded. Lumbar region prepped and draped sterilely. Incision made from L4-S1. Dissection performed bilaterally. Retractor placed. Fluoroscopy used. Levels confirmed. Gill procedure was then performed at L5-S1 by removing the entire lamina and inferior facet complex bilaterally of L5. Rudimentary inferior process off of the L5 pedicle was also resected. A superior facetectomy of S1 were performed bilaterally. Ligament flavum and pannus were elevated and resected. Very wide decompressive foraminotomies were performed on course exiting  L5 and S1 nerve roots bilaterally. Bilateral decompressive laminotomies were then performed at L4-5 using Leksell rongeurs high-speed drill and Kerrison rongeurs to remove the inferior aspect of lamina of L4 the entire inferior facet of L4 bilaterally the majority of the superior facet of L5 bilaterally. Once again ligament flavum was elevated and resected. Foraminotomies were completed along the course of the L4 and L5 nerve roots. Bilateral discectomies were then performed at L4 and L5 and L5 and S1. Disc space was distracted and distractors left the patient's right side. Starting first at L5-S1 disc space was scraped and prepared with various curettes and the left side to prepare for interbody fusion. A 9 mm x 12 degree nitrite expandable cage was then impacted into place and expanded to its full extent. Distractor was removed patient's right side. Disc space with significant for the right side. Morselized autograft packed in the interspace. A second 9 mm x 12 cage was impacted in place and expanded to its full extent. Disc space was then prepared on the left side. Soft tissue removed. A 10 mm x 12 cage was impacted into place and extended to its full extent. Distractor was removed patient's right side. Disc space prepared in the right side. Disc space packed with morselized autograft. Second cage was impacted in place and expanded. Pedicles at L4 and L5 and S1 were notified using surface landmarks and intraoperative fluoroscopy. Supination bone was removed over the pedicle. Each pedicle was then probed using pedicle all each pedicle tract was probed and found to be solidly within the bone. Each pedicle all tract was tapped and then 5.75 mm radius brand screws from [employer] were placed bilaterally at L4 and L5 and 6.75 mm screws placed bilaterally at S1. Short segment rod was contoured and placed of the screws at L4-5 and S1. Locking caps placed of the screw. Locking caps were then engaged with the  construct under compression. Morselized autograft was packed posterolaterally prior to this after the lateral residual facets and transverse processes and sacral alar had been decorticated. Transverse connector was placed. Final images which were taken earlier revealed good positioning of the cages and hardware at the proper level with normal alignment is fine. Wounds then irrigated one final time. Gelfoam was placed up for hemostasis. Vancomycin powder was left in the deep wound space. Wounds and closed in layers with Vicryl sutures. Steri-Strips and sterile dressing were applied. No apparent complications. Patient tolerated the procedure well and she returns to the recovery room postoperatively

## 2015-12-17 NOTE — Anesthesia Postprocedure Evaluation (Signed)
Anesthesia Post Note  Patient: Jenna Trevino  Procedure(s) Performed: Procedure(s) (LRB): Posterior lumbar interbody fusion  lumbar four-five,lumbar five-sacral one (N/A)  Patient location during evaluation: PACU Anesthesia Type: General Level of consciousness: awake and alert Pain management: pain level controlled Vital Signs Assessment: post-procedure vital signs reviewed and stable Respiratory status: spontaneous breathing, nonlabored ventilation, respiratory function stable and patient connected to nasal cannula oxygen Cardiovascular status: blood pressure returned to baseline and stable Postop Assessment: no signs of nausea or vomiting Anesthetic complications: no    Last Vitals:  Filed Vitals:   12/17/15 1215 12/17/15 1230  BP:  108/66  Pulse: 49 64  Temp:    Resp: 18 22    Last Pain:  Filed Vitals:   12/17/15 1239  PainSc: 10-Worst pain ever                 Mizuki Hoel DAVID

## 2015-12-17 NOTE — Transfer of Care (Signed)
Immediate Anesthesia Transfer of Care Note  Patient: Jenna Trevino  Procedure(s) Performed: Procedure(s): Posterior lumbar interbody fusion  lumbar four-five,lumbar five-sacral one (N/A)  Patient Location: PACU  Anesthesia Type:General  Level of Consciousness: awake, alert  and patient cooperative  Airway & Oxygen Therapy: Patient Spontanous Breathing and Patient connected to nasal cannula oxygen  Post-op Assessment: Report given to RN, Post -op Vital signs reviewed and stable and Patient moving all extremities X 4  Post vital signs: Reviewed and stable  Last Vitals:  Filed Vitals:   12/17/15 0653 12/17/15 1152  BP: 121/77 106/65  Pulse: 58 61  Temp: 36.7 C   Resp:  16    Complications: No apparent anesthesia complications

## 2015-12-17 NOTE — Progress Notes (Signed)
Utilization review completed.  

## 2015-12-18 MED ORDER — PROMETHAZINE HCL 25 MG PO TABS
25.0000 mg | ORAL_TABLET | Freq: Four times a day (QID) | ORAL | Status: DC | PRN
  Administered 2015-12-18: 25 mg via ORAL
  Filled 2015-12-18: qty 1

## 2015-12-18 MED ORDER — OXYCODONE HCL 15 MG PO TABS: 15.0000 mg | ORAL_TABLET | ORAL | Status: DC | PRN

## 2015-12-18 MED ORDER — DIAZEPAM 5 MG PO TABS: 5.0000 mg | ORAL_TABLET | Freq: Four times a day (QID) | ORAL | Status: DC | PRN

## 2015-12-18 MED FILL — Heparin Sodium (Porcine) Inj 1000 Unit/ML: INTRAMUSCULAR | Qty: 30 | Status: AC

## 2015-12-18 MED FILL — Sodium Chloride IV Soln 0.9%: INTRAVENOUS | Qty: 2000 | Status: AC

## 2015-12-18 NOTE — Discharge Instructions (Signed)

## 2015-12-18 NOTE — Evaluation (Signed)
Physical Therapy Evaluation Patient Details Name: Jenna Trevino MRN: [id number] DOB: Aug 22, 1981 Today's Date: 12/18/2015   History of Present Illness  Pt is a 35 y.o. female s/p PLIF L4-L5, L5-S1  Clinical Impression  Pt admitted with above diagnosis. Pt currently with functional limitations due to the deficits listed below (see PT Problem List). At the time of PT eval pt was able to perform transfers and ambulation with increased time and supervision for safety. Pt very guarded with all movement and very anxious/tearful throughout session. Pt will benefit from skilled PT to increase their independence and safety with mobility to allow discharge to the venue listed below.       Follow Up Recommendations Outpatient PT    Equipment Recommendations  None recommended by PT    Recommendations for Other Services       Precautions / Restrictions Precautions Precautions: Back;Fall Precaution Booklet Issued: Yes (comment) Precaution Comments: Educated pt on precautions. Required Braces or Orthoses: Spinal Brace Spinal Brace: Lumbar corset;Applied in sitting position Restrictions Weight Bearing Restrictions: No      Mobility  Bed Mobility Overal bed mobility: Needs Assistance Bed Mobility: Rolling;Sidelying to Sit;Sit to Sidelying Rolling: Supervision Sidelying to sit: Supervision     Sit to sidelying: Supervision General bed mobility comments: Step-by-step cues for proper log roll technique. Pt was able to complete with use of rails for support.   Transfers Overall transfer level: Needs assistance Equipment used: None Transfers: Sit to/from Stand Sit to Stand: Supervision         General transfer comment: Supervision for safety. Good hand placement and technique.  Ambulation/Gait Ambulation/Gait assistance: Supervision Ambulation Distance (Feet): 150 Feet Assistive device: None Gait Pattern/deviations: Step-through pattern;Decreased stride length;Trunk flexed (B knees  flexed) Gait velocity: Decreased Gait velocity interpretation: Below normal speed for age/gender General Gait Details: Pt very guarded with gait and tearful. States she feels better when she is moving at beginning of gait training, however by end of gait training pt complaining of increased pain  Stairs Stairs: Yes Stairs assistance: Supervision Stair Management: One rail Right;Alternating pattern;Step to pattern;Forwards Number of Stairs: 5 General stair comments: VC's for general safety. Encouraged step-to pattern for increased spinal stability  Wheelchair Mobility    Modified Rankin (Stroke Patients Only)       Balance Overall balance assessment: Needs assistance Sitting-balance support: Feet supported;No upper extremity supported Sitting balance-Leahy Scale: Normal     Standing balance support: No upper extremity supported;During functional activity Standing balance-Leahy Scale: Fair                               Pertinent Vitals/Pain Pain Assessment: 0-10 Pain Score: 8  Pain Location: Back - mainly L side Pain Descriptors / Indicators: Operative site guarding;Aching Pain Intervention(s): Limited activity within patient's tolerance;Monitored during session;Repositioned    Home Living Family/patient expects to be discharged to:: Private residence Living Arrangements: Spouse/significant other;Children;Other relatives;Parent Available Help at Discharge: Family;Available 24 hours/day Type of Home: House Home Access: Stairs to enter Entrance Stairs-Rails: Doctor, general practice of Steps: 5 Home Layout: One level Home Equipment: None Additional Comments: Primary caregiver for mother and uncle.    Prior Function Level of Independence: Independent         Comments: Increased difficulty with ADLs/toilet transfers but managing independently.     Hand Dominance   Dominant Hand: Right    Extremity/Trunk Assessment   Upper Extremity  Assessment: Defer to OT evaluation  Lower Extremity Assessment: Generalized weakness (B knees appear flexed, and pt unable to fully extend w/ cues)      Cervical / Trunk Assessment: Other exceptions  Communication   Communication: No difficulties  Cognition Arousal/Alertness: Awake/alert Behavior During Therapy: Anxious (crying (sobbing at times) during session) Overall Cognitive Status: Within Functional Limits for tasks assessed                      General Comments      Exercises        Assessment/Plan    PT Assessment Patient needs continued PT services  PT Diagnosis Difficulty walking;Acute pain   PT Problem List Decreased strength;Decreased range of motion;Decreased activity tolerance;Decreased balance;Decreased mobility;Decreased knowledge of use of DME;Decreased knowledge of precautions;Decreased safety awareness;Pain  PT Treatment Interventions DME instruction;Gait training;Functional mobility training;Stair training;Therapeutic activities;Therapeutic exercise;Neuromuscular re-education;Patient/family education   PT Goals (Current goals can be found in the Care Plan section) Acute Rehab PT Goals Patient Stated Goal: Decrease pain PT Goal Formulation: With patient/family Time For Goal Achievement: 12/25/15 Potential to Achieve Goals: Good    Frequency Min 5X/week   Barriers to discharge        Co-evaluation               End of Session Equipment Utilized During Treatment: Back brace Activity Tolerance: Patient limited by pain (and anxiety) Patient left: in chair;with call bell/phone within reach;with family/visitor present Nurse Communication: Mobility status         Time: 0086-7619 PT Time Calculation (min) (ACUTE ONLY): 23 min   Charges:   PT Evaluation $PT Eval Moderate Complexity: 1 Procedure PT Treatments $Gait Training: 8-22 mins   PT G Codes:        Conni Slipper 01/10/2016, 10:05 AM   Conni Slipper, PT,  DPT Acute Rehabilitation Services Pager: [phone]

## 2015-12-18 NOTE — Discharge Summary (Signed)
Physician Discharge Summary  Patient ID: Jenna Trevino MRN: [id number] DOB/AGE: Dec 18, 1980 35 y.o.  Admit date: 12/17/2015 Discharge date: 12/18/2015  Admission Diagnoses:  Discharge Diagnoses:  Principal Problem:   Spondylisthesis   Discharged Condition: good  Hospital Course: The patient was admitted to the hospital where she underwent an uncomplicated two-level lumbar decompression and fusion. Postoperatively she is doing well. Preoperative lower extremity symptoms are resolved. Back pain is controlled. Mobilizing without issue. Ready for discharge home.  Consults:   Significant Diagnostic Studies:   Treatments:   Discharge Exam: Blood pressure 116/83, pulse 65, temperature 97.5 F (36.4 C), temperature source Oral, resp. rate 18, height 5\' 11"  (1.803 m), weight 91.173 kg (201 lb), SpO2 95 %, not currently breastfeeding. Awake and alert. Oriented and appropriate. Cranial nerve function intact. Motor and sensory function extremities normal. Wound clean and dry. Chest and abdomen benign.  Disposition: 01-Home or Self Care     Medication List    STOP taking these medications        oxyCODONE-acetaminophen 5-325 MG tablet  Commonly known as:  PERCOCET/ROXICET      TAKE these medications        albuterol 108 (90 Base) MCG/ACT inhaler  Commonly known as:  PROVENTIL HFA;VENTOLIN HFA  Inhale 2 puffs into the lungs every 6 (six) hours as needed for wheezing or shortness of breath.     diazepam 5 MG tablet  Commonly known as:  VALIUM  Take 1-2 tablets (5-10 mg total) by mouth every 6 (six) hours as needed for muscle spasms.     ibuprofen 600 MG tablet  Commonly known as:  ADVIL,MOTRIN  Take 1 tablet (600 mg total) by mouth every 6 (six) hours.     levonorgestrel 20 MCG/24HR IUD  Commonly known as:  MIRENA  1 each by Intrauterine route continuous.     oxyCODONE 15 MG immediate release tablet  Commonly known as:  ROXICODONE  Take 1-2 tablets (15-30 mg total) by mouth  every 4 (four) hours as needed for pain.     prenatal multivitamin Tabs tablet  Take 1 tablet by mouth daily at 12 noon.           Follow-up Information    Follow up with Canary Fister A, MD In 2 weeks.   Specialty:  Neurosurgery   Contact information:   [address] [place] [phone]       Signed: [phone] 12/18/2015, 9:54 AM

## 2015-12-18 NOTE — Evaluation (Signed)
Occupational Therapy Evaluation Patient Details Name: Jenna Trevino MRN: [id number] DOB: [birth year] Today's Date: 12/18/2015    History of Present Illness Pt is a 35 y.o. female s/p PLIF L4-L5, L5-S1   Clinical Impression   Pt presenting with a lot of anxiety during functional mobility; very tearful and nausea with emesis but agreeable to participate in therapy this AM. Pt reports she was independent with ADLs and mobility PTA. Currently pt is overall supervision for safety with ADLs and functional mobility. Educated on back precautions and maintaining during functional activities; pt doing well maintaining back precautions during activities in session. Pt planning to d/c home with 24/7 supervision from family. Pt ready to d/c from OT standpoint; signing off at this time. Thank you for this referral.    Follow Up Recommendations  No OT follow up;Supervision - Intermittent    Equipment Recommendations  3 in 1 bedside comode    Recommendations for Other Services PT consult     Precautions / Restrictions Precautions Precautions: Back;Fall Precaution Booklet Issued: Yes (comment) Precaution Comments: Educated pt on precautions. Required Braces or Orthoses: Spinal Brace Spinal Brace: Lumbar corset Restrictions Weight Bearing Restrictions: No      Mobility Bed Mobility Overal bed mobility: Needs Assistance Bed Mobility: Rolling;Sidelying to Sit Rolling: Supervision Sidelying to sit: Supervision       General bed mobility comments: Supervision for safety. Pt with difficulty following log roll technique for bed mobility despite verbal cues.  Transfers Overall transfer level: Needs assistance Equipment used: None Transfers: Sit to/from Stand Sit to Stand: Supervision         General transfer comment: Supervision for safety. Good hand placement and technique.    Balance Overall balance assessment: Needs assistance Sitting-balance support: Feet supported;No upper  extremity supported Sitting balance-Leahy Scale: Normal     Standing balance support: No upper extremity supported Standing balance-Leahy Scale: Good                              ADL Overall ADL's : Needs assistance/impaired Eating/Feeding: Set up;Sitting   Grooming: Supervision/safety;Standing Grooming Details (indicate cue type and reason): Educated on cup for oral care Upper Body Bathing: Set up;Sitting   Lower Body Bathing: Supervison/ safety;Sit to/from stand   Upper Body Dressing : Set up;Sitting Upper Body Dressing Details (indicate cue type and reason): Pt able to don back brace sitting EOB with set up. Lower Body Dressing: Supervision/safety;Sit to/from stand Lower Body Dressing Details (indicate cue type and reason): Pt able to cross one foot over opposite knee. Educated on compensatory strategies for LB ADLs. Pt refusing to wear socks with functional mobility.  Toilet Transfer: Supervision/safety;Ambulation;BSC (BSC over toilet)   Toileting- Clothing Manipulation and Hygiene: Supervision/safety;Sit to/from stand Toileting - Clothing Manipulation Details (indicate cue type and reason): Discussed maintaining back precautions during toilet hygiene. Pt able to demo reaching bottom for toilet hygiene while maintaining back precautions.    Tub/Shower Transfer Details (indicate cue type and reason): Educated on use of a 3 in 1 in tub as a seat and provided handout. Functional mobility during ADLs: Supervision/safety General ADL Comments: Pts fiance present for OT session. Pt very tearful and seems to be presenting with a lot of anxiety with mobility. Educated on back precautions during ADL/IADL activities, placing frequently used items at counter top height, bed mobility; pt verbalized understanding.     Vision     Perception     Praxis  Pertinent Vitals/Pain Pain Assessment: 0-10 Pain Score: 7  Pain Location: back with mobility Pain Descriptors /  Indicators: Aching;Sore;Spasm;Tightness Pain Intervention(s): Limited activity within patient's tolerance;Monitored during session;Repositioned;Utilized relaxation techniques     Hand Dominance Right   Extremity/Trunk Assessment Upper Extremity Assessment Upper Extremity Assessment: Overall WFL for tasks assessed   Lower Extremity Assessment Lower Extremity Assessment: Defer to PT evaluation   Cervical / Trunk Assessment Cervical / Trunk Assessment: Other exceptions Cervical / Trunk Exceptions: pt s/p lumbar sx   Communication Communication Communication: No difficulties   Cognition Arousal/Alertness: Awake/alert Behavior During Therapy: Anxious (tearful) Overall Cognitive Status: Within Functional Limits for tasks assessed                     General Comments       Exercises       Shoulder Instructions      Home Living Family/patient expects to be discharged to:: Private residence Living Arrangements: Spouse/significant other;Children;Other relatives;Parent Available Help at Discharge: Family;Available 24 hours/day Type of Home: House Home Access: Stairs to enter [employer] of Steps: 5 Entrance Stairs-Rails: Right;Left Home Layout: One level     Bathroom Shower/Tub: Tub/shower unit;Curtain Shower/tub characteristics: Engineer, building services: Standard     Home Equipment: None   Additional Comments: Primary caregiver for mother and uncle.      Prior Functioning/Environment Level of Independence: Independent        Comments: Increased difficulty with ADLs/toilet transfers but managing independently.    OT Diagnosis: Generalized weakness;Acute pain   OT Problem List:     OT Treatment/Interventions:      OT Goals(Current goals can be found in the care plan section) Acute Rehab OT Goals OT Goal Formulation: All assessment and education complete, DC therapy  OT Frequency:     Barriers to D/C:            Co-evaluation               End of Session Equipment Utilized During Treatment: Back brace Nurse Communication: Other (comment) (needs 3 in 1, requesting nausea meds)  Activity Tolerance: Patient tolerated treatment well;Other (comment) (anxiety-tearful, nausea with emesis) Patient left: in chair;with call bell/phone within reach;with family/visitor present   Time: 1275-1700 OT Time Calculation (min): 27 min Charges:  OT General Charges $OT Visit: 1 Procedure OT Evaluation $OT Eval Moderate Complexity: 1 Procedure OT Treatments $Self Care/Home Management : 8-22 mins G-Codes:     Gaye Alken M.S., OTR/L Pager: [phone]  12/18/2015, 8:30 AM

## 2015-12-18 NOTE — Progress Notes (Signed)
Patient alert and oriented, mae's well, voiding adequate amount of urine, swallowing without difficulty, c/o soreness and medication given prior to discharged. Patient discharged home with family. Script and discharged instructions given to patient. Patient and family stated understanding of d/c instructions given and has an appointment with MD.

## 2015-12-25 ENCOUNTER — Emergency Department (HOSPITAL_COMMUNITY)
Admission: EM | Admit: 2015-12-25 | Discharge: 2015-12-25 | Disposition: A | Discharge status: Home / Self Care | Payer: Medicaid Other | Attending: Emergency Medicine | Admitting: Emergency Medicine

## 2015-12-25 ENCOUNTER — Emergency Department (HOSPITAL_COMMUNITY): Payer: Medicaid Other

## 2015-12-25 ENCOUNTER — Encounter (HOSPITAL_COMMUNITY): Payer: Self-pay | Admitting: Emergency Medicine

## 2015-12-25 DIAGNOSIS — Z8679 Personal history of other diseases of the circulatory system: Secondary | ICD-10-CM | POA: Diagnosis not present

## 2015-12-25 DIAGNOSIS — M1990 Unspecified osteoarthritis, unspecified site: Secondary | ICD-10-CM | POA: Insufficient documentation

## 2015-12-25 DIAGNOSIS — J45909 Unspecified asthma, uncomplicated: Secondary | ICD-10-CM | POA: Insufficient documentation

## 2015-12-25 DIAGNOSIS — G8929 Other chronic pain: Secondary | ICD-10-CM | POA: Diagnosis not present

## 2015-12-25 DIAGNOSIS — K5900 Constipation, unspecified: Secondary | ICD-10-CM | POA: Insufficient documentation

## 2015-12-25 DIAGNOSIS — M549 Dorsalgia, unspecified: Secondary | ICD-10-CM

## 2015-12-25 DIAGNOSIS — F410 Panic disorder [episodic paroxysmal anxiety] without agoraphobia: Secondary | ICD-10-CM | POA: Insufficient documentation

## 2015-12-25 DIAGNOSIS — Z8719 Personal history of other diseases of the digestive system: Secondary | ICD-10-CM | POA: Diagnosis not present

## 2015-12-25 DIAGNOSIS — Z862 Personal history of diseases of the blood and blood-forming organs and certain disorders involving the immune mechanism: Secondary | ICD-10-CM | POA: Diagnosis not present

## 2015-12-25 DIAGNOSIS — G8918 Other acute postprocedural pain: Secondary | ICD-10-CM

## 2015-12-25 DIAGNOSIS — Z79899 Other long term (current) drug therapy: Secondary | ICD-10-CM | POA: Insufficient documentation

## 2015-12-25 LAB — URINALYSIS, ROUTINE W REFLEX MICROSCOPIC
Bilirubin Urine: NEGATIVE
Glucose, UA: NEGATIVE mg/dL
HGB URINE DIPSTICK: NEGATIVE
Ketones, ur: 15 mg/dL — AB
LEUKOCYTES UA: NEGATIVE
Nitrite: NEGATIVE
PROTEIN: NEGATIVE mg/dL
SPECIFIC GRAVITY, URINE: 1.022 (ref 1.005–1.030)
pH: 7 (ref 5.0–8.0)

## 2015-12-25 LAB — COMPREHENSIVE METABOLIC PANEL
ALT: 26 U/L (ref 14–54)
ANION GAP: 12 (ref 5–15)
AST: 20 U/L (ref 15–41)
Albumin: 3.5 g/dL (ref 3.5–5.0)
Alkaline Phosphatase: 76 U/L (ref 38–126)
BUN: 11 mg/dL (ref 6–20)
CALCIUM: 9.4 mg/dL (ref 8.9–10.3)
CHLORIDE: 105 mmol/L (ref 101–111)
CO2: 22 mmol/L (ref 22–32)
Creatinine, Ser: 0.81 mg/dL (ref 0.44–1.00)
Glucose, Bld: 112 mg/dL — ABNORMAL HIGH (ref 65–99)
Potassium: 4.2 mmol/L (ref 3.5–5.1)
SODIUM: 139 mmol/L (ref 135–145)
Total Bilirubin: 0.5 mg/dL (ref 0.3–1.2)
Total Protein: 7.5 g/dL (ref 6.5–8.1)

## 2015-12-25 LAB — CBC
HCT: 34.8 % — ABNORMAL LOW (ref 36.0–46.0)
HEMOGLOBIN: 11.5 g/dL — AB (ref 12.0–15.0)
MCH: 29.3 pg (ref 26.0–34.0)
MCHC: 33 g/dL (ref 30.0–36.0)
MCV: 88.5 fL (ref 78.0–100.0)
Platelets: 439 10*3/uL — ABNORMAL HIGH (ref 150–400)
RBC: 3.93 MIL/uL (ref 3.87–5.11)
RDW: 13.3 % (ref 11.5–15.5)
WBC: 9.7 10*3/uL (ref 4.0–10.5)

## 2015-12-25 LAB — POC URINE PREG, ED: PREG TEST UR: NEGATIVE

## 2015-12-25 LAB — LIPASE, BLOOD: LIPASE: 23 U/L (ref 11–51)

## 2015-12-25 MED ORDER — MAGNESIUM CITRATE PO SOLN
1.0000 | Freq: Once | ORAL | Status: AC
  Administered 2015-12-25: 1 via ORAL
  Filled 2015-12-25: qty 296

## 2015-12-25 MED ORDER — ONDANSETRON 4 MG PO TBDP
4.0000 mg | ORAL_TABLET | Freq: Once | ORAL | Status: AC
  Administered 2015-12-25: 4 mg via ORAL
  Filled 2015-12-25: qty 1

## 2015-12-25 MED ORDER — OXYCODONE-ACETAMINOPHEN 5-325 MG PO TABS
1.0000 | ORAL_TABLET | Freq: Once | ORAL | Status: AC
  Administered 2015-12-25: 1 via ORAL
  Filled 2015-12-25: qty 1

## 2015-12-25 NOTE — ED Provider Notes (Signed)
CSN: [id number]     Arrival date & time 12/25/15  5176 History   First MD Initiated Contact with Patient 12/25/15 [phone]     Chief Complaint  Patient presents with  . Abdominal Pain  . Back Pain     (Consider location/radiation/quality/duration/timing/severity/associated sxs/prior Treatment) HPI Comments: 35 year old female with past medical history including chronic back pain status post recent lumbar surgery, anxiety/depression, GERD who presents with back pain and constipation. The patient had lumbar spine surgery 1 week ago and was prescribed oxycodone. She hasn't had a bowel movement since before the surgery and states that she stopped taking the oxycodone 2 days ago because it was making her sick to her stomach and vomit. Her vomiting has improved since stopping the oxycodone but she states that her back pain is now severe since she has not taken pain medication. She tried a fleets enemas at home without relief. She reports that she had a bowel movement this morning which was very painful but did provide some relief. She denies any fevers or drainage from incision site.  Patient is a 35 y.o. female presenting with abdominal pain and back pain. The history is provided by the patient.  Abdominal Pain Back Pain Associated symptoms: abdominal pain     Past Medical History  Diagnosis Date  . Obsessive compulsive disorder   . Asthma     inhaler  . History of migraine     last one on 12/07/15  . Carpal tunnel syndrome     bilateral  . Arthritis   . Joint pain   . Chronic back pain     spondylolisthesis.Scoliosis  . GERD (gastroesophageal reflux disease)     happens occasionally and will take OTC meds  . Diverticulosis   . Anemia     not on any meds  . Anxiety     doesn't take any meds  . Depression     doesn't take any meds  . Panic attacks     doesn't take any meds  . Insomnia     doesn't take any meds   Past Surgical History  Procedure Laterality Date  . Total knee  arthroplasty Left   . Dilation and evacuation  2007  . Colonoscopy    . Back surgery     Family History  Problem Relation Age of Onset  . ADD / ADHD Son   . Heart disease Mother   . Cancer Maternal Aunt   . Diabetes Maternal Aunt   . Cancer Maternal Uncle    Social History  Substance Use Topics  . Smoking status: Never Smoker   . Smokeless tobacco: Never Used  . Alcohol Use: No   OB History    Gravida Para Term Preterm AB TAB SAB Ectopic Multiple Living   6 3 3  3  3   0 3     Review of Systems  Gastrointestinal: Positive for abdominal pain.  Musculoskeletal: Positive for back pain.   10 Systems reviewed and are negative for acute change except as noted in the HPI.    Allergies  Bee venom and Tobacco  Home Medications   Prior to Admission medications   Medication Sig Start Date End Date Taking? Authorizing Provider  albuterol (PROVENTIL HFA;VENTOLIN HFA) 108 (90 BASE) MCG/ACT inhaler Inhale 2 puffs into the lungs every 6 (six) hours as needed for wheezing or shortness of breath.   Yes Historical Provider, MD  diazepam (VALIUM) 5 MG tablet Take 1-2 tablets (5-10 mg total) by mouth  every 6 (six) hours as needed for muscle spasms. 12/18/15  Yes Julio Sicks, MD  ibuprofen (ADVIL,MOTRIN) 600 MG tablet Take 1 tablet (600 mg total) by mouth every 6 (six) hours. Patient taking differently: Take 600 mg by mouth every 6 (six) hours as needed for moderate pain.  06/15/15  Yes Marlow Baars, MD  oxyCODONE (ROXICODONE) 15 MG immediate release tablet Take 1-2 tablets (15-30 mg total) by mouth every 4 (four) hours as needed for pain. 12/18/15  Yes Julio Sicks, MD  Prenatal Vit-Fe Fumarate-FA (PRENATAL MULTIVITAMIN) TABS tablet Take 1 tablet by mouth daily at 12 noon.   Yes Historical Provider, MD  promethazine (PHENERGAN) 25 MG tablet Take 25 mg by mouth every 6 (six) hours as needed for nausea or vomiting.   Yes Historical Provider, MD  levonorgestrel (MIRENA) 20 MCG/24HR IUD 1 each by  Intrauterine route continuous.    Historical Provider, MD   BP 113/88 mmHg  Pulse 73  Temp(Src) 97.9 F (36.6 C) (Oral)  Resp 20  Ht 6' (1.829 m)  Wt 201 lb (91.173 kg)  BMI 27.25 kg/m2  SpO2 99%  LMP  Physical Exam  Constitutional: She is oriented to person, place, and time. She appears well-developed and well-nourished. No distress.  Uncomfortable  HENT:  Head: Normocephalic and atraumatic.  Moist mucous membranes  Eyes: Conjunctivae are normal. Pupils are equal, round, and reactive to light.  Neck: Neck supple.  Cardiovascular: Normal rate, regular rhythm and normal heart sounds.   No murmur heard. Pulmonary/Chest: Effort normal and breath sounds normal.  Abdominal: Soft. Bowel sounds are normal. She exhibits no distension. There is no tenderness.  Musculoskeletal: She exhibits no edema.  Normal strength and sensation bilateral lower extremities  Neurological: She is alert and oriented to person, place, and time.  Fluent speech  Skin: Skin is warm and dry. No rash noted. No erythema.  Lumbar surgical incision site w/ steristrips in place, c/d/i  Psychiatric: She has a normal mood and affect. Judgment normal.  Nursing note and vitals reviewed.   ED Course  Procedures (including critical care time) Labs Review Labs Reviewed  COMPREHENSIVE METABOLIC PANEL - Abnormal; Notable for the following:    Glucose, Bld 112 (*)    All other components within normal limits  CBC - Abnormal; Notable for the following:    Hemoglobin 11.5 (*)    HCT 34.8 (*)    Platelets 439 (*)    All other components within normal limits  URINALYSIS, ROUTINE W REFLEX MICROSCOPIC (NOT AT [facility]) - Abnormal; Notable for the following:    Ketones, ur 15 (*)    All other components within normal limits  LIPASE, BLOOD  POC URINE PREG, ED    Imaging Review Dg Lumbar Spine 2-3 Views  12/25/2015  CLINICAL DATA:  Sudden onset of severe back and abdominal pain today. Status post lumbar fusion last week.  EXAM: LUMBAR SPINE - 2-3 VIEW COMPARISON:  Radiographs 05/26/2014. Intraoperative images 12/17/2015. FINDINGS: Patient is status post recent L4 through S1 fusion. Hardware is intact and stable in position. There is a stable grade 1 anterolisthesis at L5-S1. The additional disc spaces are preserved. No acute osseous findings are evident. The visualized bowel gas pattern is normal. Intrauterine device noted. IMPRESSION: Intact hardware and stable alignment status post L4 through S1 fusion. No demonstrated complication. Electronically Signed   By: Carey Bullocks M.D.   On: 12/25/2015 12:05   Dg Abd 1 View  12/25/2015  CLINICAL DATA:  Sudden onset of  severe back and abdominal pain today. Status post lumbar fusion last week. EXAM: ABDOMEN - 1 VIEW COMPARISON:  Lumbar radiographs 05/26/2014. FINDINGS: The bowel gas pattern is normal. There is no evidence of bowel wall thickening, pneumatosis or free air. There are no suspicious abdominal calcifications. A small right pelvic calcification is probably a phlebolith. Postsurgical changes are noted status post L4 through S1 fusion. Intrauterine device noted. IMPRESSION: No acute abdominal findings status post recent lumbar fusion. Electronically Signed   By: Carey Bullocks M.D.   On: 12/25/2015 12:06   I have personally reviewed and evaluated these lab results as part of my medical decision-making.   EKG Interpretation None     Medications  magnesium citrate solution 1 Bottle (1 Bottle Oral Given 12/25/15 1031)  ondansetron (ZOFRAN-ODT) disintegrating tablet 4 mg (4 mg Oral Given 12/25/15 1031)  oxyCODONE-acetaminophen (PERCOCET/ROXICET) 5-325 MG per tablet 1 tablet (1 tablet Oral Given 12/25/15 1032)    MDM   Final diagnoses:  Back pain  Post-operative pain  Constipation, unspecified constipation type    Pt p/w constipation since lumbar surgery 1 week ago as well as 2 days of worsening back pain since she stopped oxycodone. On exam she was uncomfortable  but in no acute distress. Vital signs unremarkable. Incision site was clean, dry, and intact about evidence of infection. Gave the patient magnesium citrate and obtained above lab work which showed no evidence of infection or severe dehydration. Plain films show no signs of obstruction on KUB and intact hardware on lumbar spine x-rays. Patient denies any falls or trauma. I suspect that her pain is related to abruptly stopping her pain medications. Gave the patient Percocet in the ED and discussed using scheduled Tylenol with half doses of oxycodone in between at home as needed. She has follow-up with her surgeon in 2 days. Regarding constipation, I discussed Colace and MiraLAX use at home. Return precautions including fever or worsening symptoms reviewed. Patient voiced understanding and was discharged in satisfactory condition.  Laurence Spates, MD 12/25/15 1226

## 2015-12-25 NOTE — ED Notes (Signed)
Patient states she had spinal fusion from L3 to S1 Tuesday a week ago.  Patient states since that time she has not had a bowel movment.   Patient states started having abdominal pain.  Patient states she quit taking pain medications 2 days ago, because it made her sick.   Patient states now back pain severe.

## 2015-12-25 NOTE — ED Notes (Signed)
MD at the bedside  

## 2015-12-25 NOTE — ED Notes (Signed)
Pt reports wanting to go home. Pt drank fluids and reports "I am worried if that kicks in here." Pt reassured and stated this RN would speak to MD

## 2015-12-25 NOTE — ED Notes (Signed)
Pt returned from X-ray.  

## 2015-12-25 NOTE — Discharge Instructions (Signed)
START TAKING MIRALAX AND COLACE DAILY AS DIRECTED ON PACKAGING TO PREVENT/RELIEVE CONSTIPATION.

## 2017-02-05 IMAGING — CR DG CHEST 2V
2 series · 2 of 2 positions shown · non-contrast
Comparison: 01/25/2014.

CLINICAL DATA: Back surgery.  History of asthma .

EXAM:
CHEST  2 VIEW

[w chest pa]
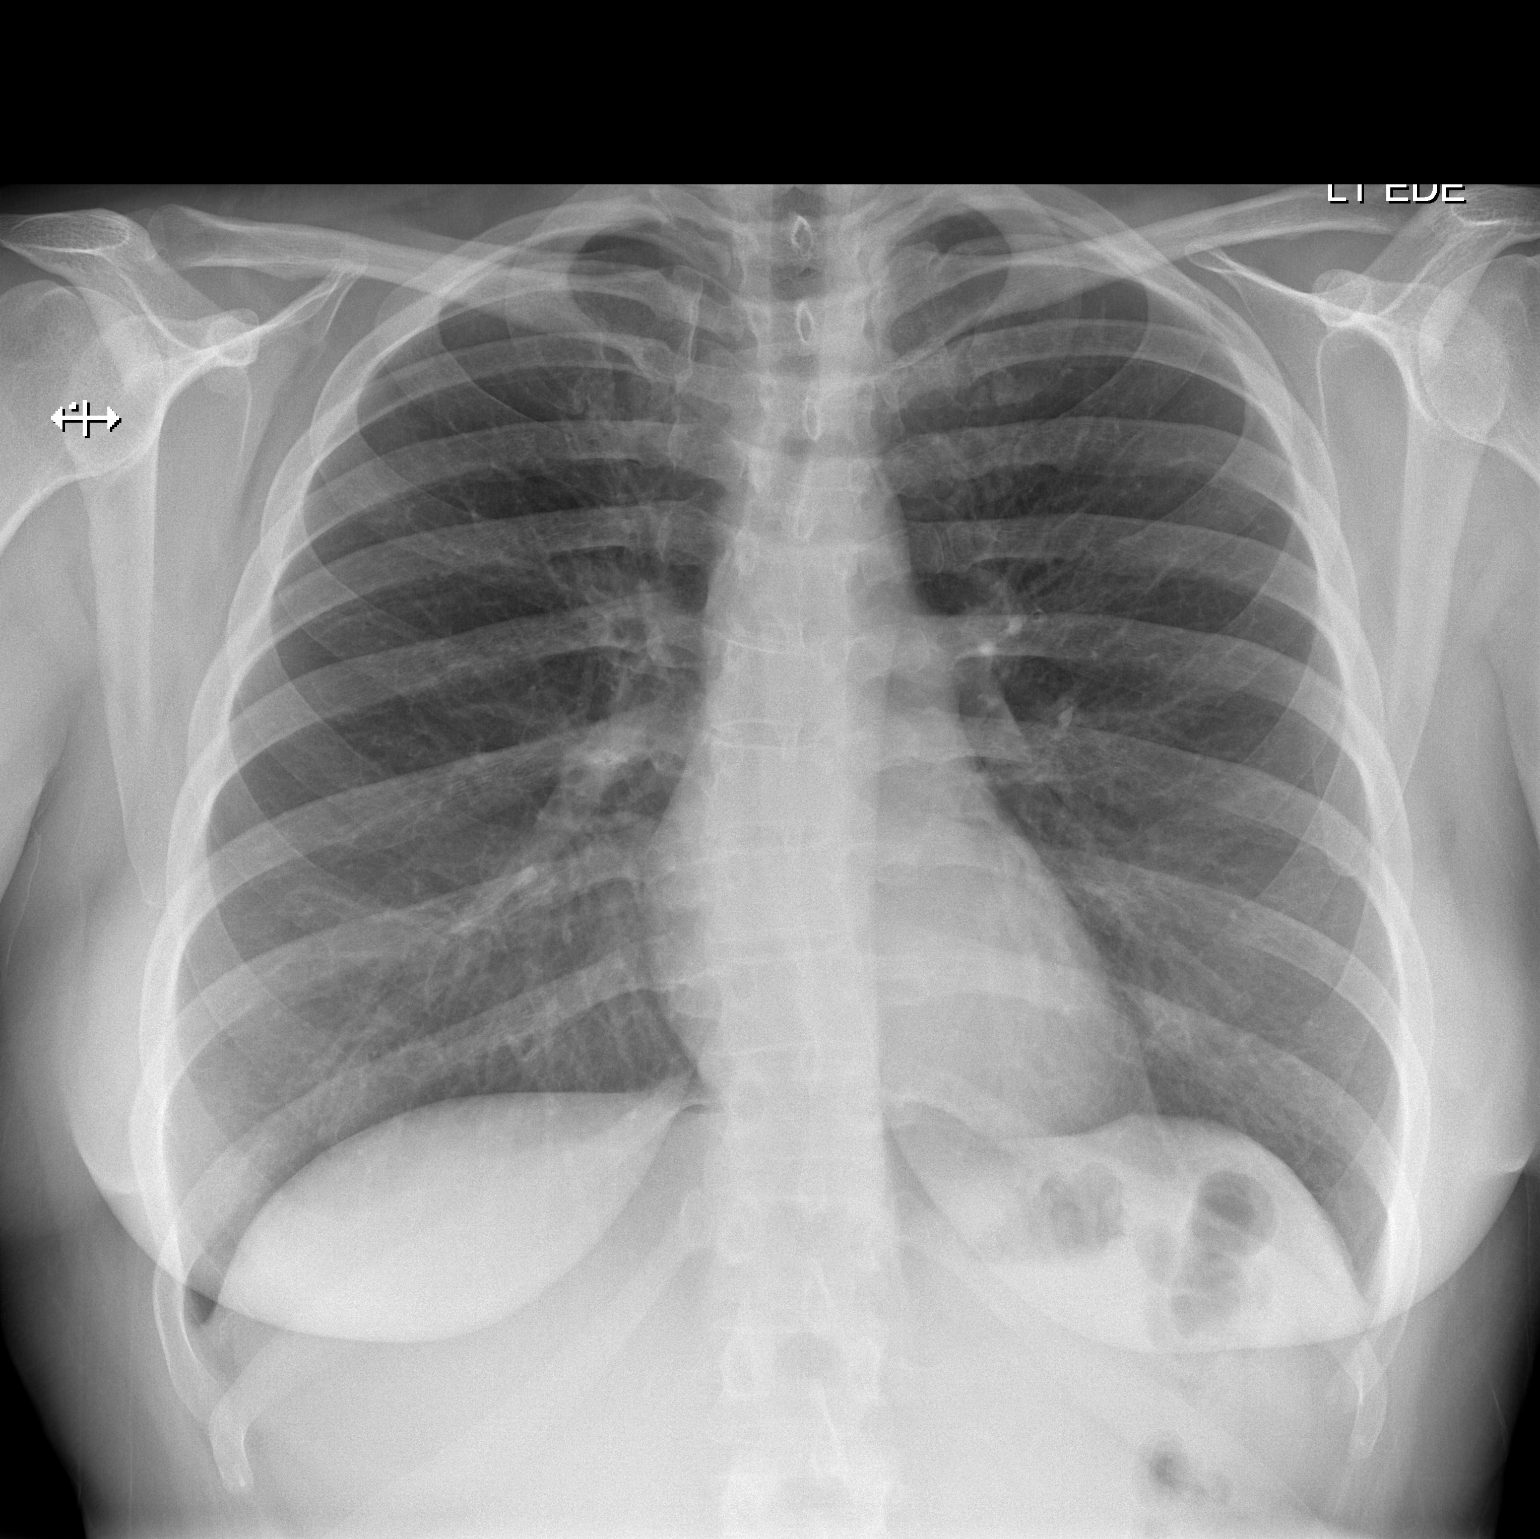

[w chest lat]
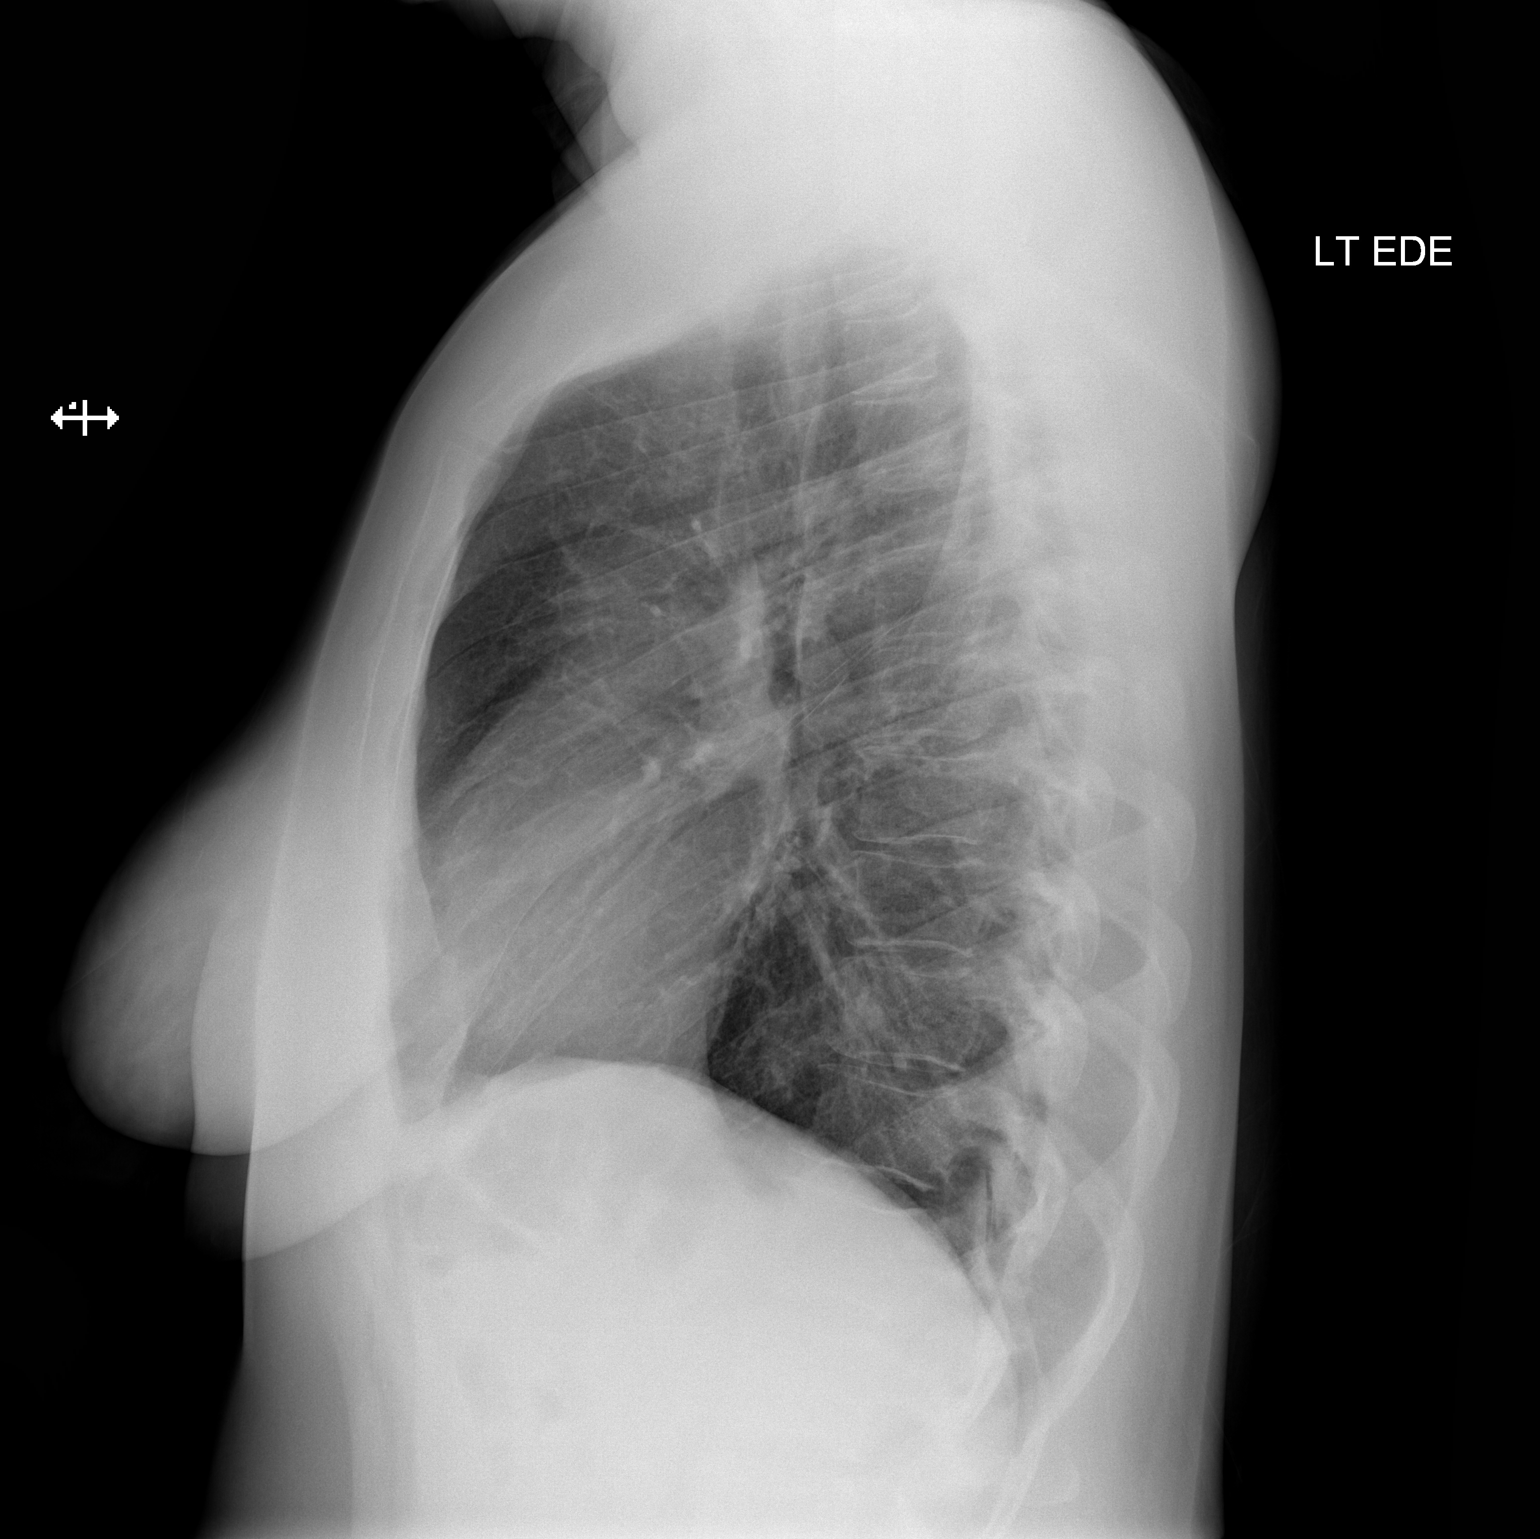

[2 of 2 positions shown; findings below may reference images not displayed]

FINDINGS: Mediastinum hilar structures normal. Mild left base subsegmental
atelectasis. No pleural effusion or pneumothorax. Heart size normal
.
IMPRESSION: Mild left base subsegmental atelectasis, otherwise negative chest.

## 2017-02-13 IMAGING — RF DG C-ARM 61-120 MIN
1 series · 2 of 2 positions shown · non-contrast
Comparison: None.

CLINICAL DATA: L4-S1 PLIF, fluoroscopy time 55 seconds.

EXAM:
DG C-ARM 61-120 MIN; LUMBAR SPINE - 2-3 VIEW

[Series 1: run · 2 of 2 slices shown]
[im 1/2]
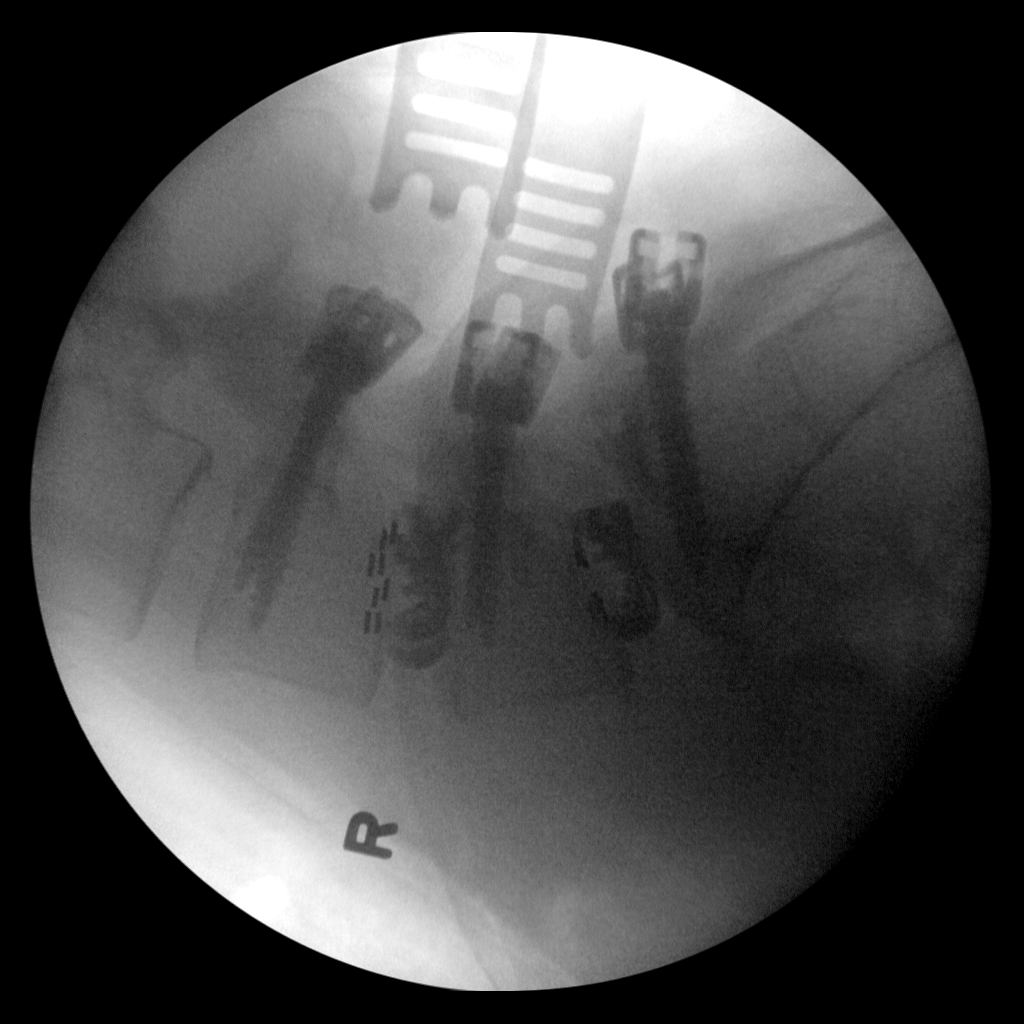
[im 2/2]
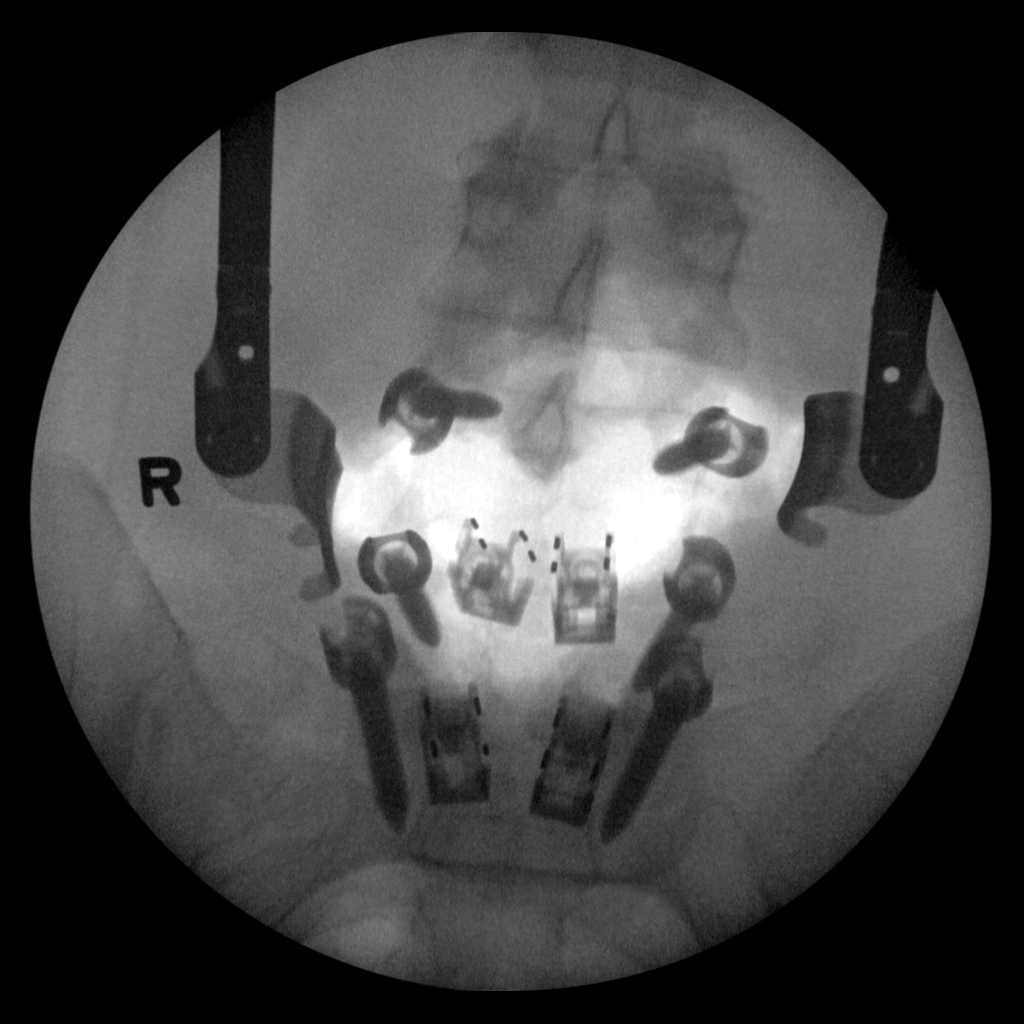

[2 of 2 positions shown; findings below may reference images not displayed]

FINDINGS: Two fluoroscopic images are provided of the lumbosacral junction
showing pedicle screws at the L4 through S1 levels. The screws
appear well positioned. Interbody grafts/spacers are seen at the
L4-5 and L5-S1 levels, also well-positioned. Surgical instruments
overlie the L4 and L5 spinous processes. No surgical complicating
feature seen.

Fluoroscopy provided for 55 seconds.
IMPRESSION: Fluoroscopic spot images showing pedicle screws at the L4-S1 levels,
with intervening interbody graft/spacers, all hardware appearing
intact and well positioned. No surgical complicating feature seen.

Fluoroscopy provided for 55 seconds.

## 2017-06-30 ENCOUNTER — Other Ambulatory Visit: Payer: Self-pay | Admitting: Neurosurgery

## 2017-06-30 DIAGNOSIS — M542 Cervicalgia: Secondary | ICD-10-CM

## 2017-07-25 ENCOUNTER — Other Ambulatory Visit: Payer: Self-pay

## 2017-11-19 ENCOUNTER — Inpatient Hospital Stay: Admission: RE | Admit: 2017-11-19 | Payer: Medicaid Other | Source: Ambulatory Visit

## 2018-07-26 ENCOUNTER — Other Ambulatory Visit: Payer: Self-pay | Admitting: Neurosurgery

## 2018-08-05 ENCOUNTER — Encounter (HOSPITAL_COMMUNITY): Payer: Self-pay

## 2018-08-05 NOTE — Pre-Procedure Instructions (Signed)
Jenna Trevino  08/05/2018      [facility] - [place] - [address] [place] [address] Phone: [phone] Fax: [phone]  [id number] - [place], [place] - [address] AT [facility] [address] [place] [address] Phone: [phone] Fax: [phone]  [id number] - [place], [place] - [address] AT [facility] [address] [place] [address] Phone: [phone] Fax: [phone]    Your procedure is scheduled on Friday October 4th.  Report to [facility] Admitting at [phone] A.M.  Call this number if you have problems the morning of surgery:  [phone]   Remember:  Do not eat or drink after midnight.    Take these medicines the morning of surgery with A SIP OF WATER   Tylenol (if needed)  Albuterol (if needed) Bring inhaler with you.  Prilosec (if needed)  Oxycodone (if needed)   7 days prior to surgery STOP taking any Aspirin(unless otherwise instructed by your surgeon), Aleve, Naproxen, Ibuprofen, Motrin, Advil, Goody's, BC's, all herbal medications, fish oil, and all vitamins     Do not wear jewelry, make-up or nail polish.  Do not wear lotions, powders, or perfumes, or deodorant.  Do not shave 48 hours prior to surgery.  Men may shave face and neck.  Do not bring valuables to the hospital.  [facility] is not responsible for any belongings or valuables.  Contacts, dentures or bridgework may not be worn into surgery.  Leave your suitcase in the car.  After surgery it may be brought to your room.  For patients admitted to the hospital, discharge time will be determined by your treatment team.  Patients discharged the day of surgery will not be allowed to drive home.    [place]- Preparing For Surgery  Before surgery, you can play an important role. Because skin is not sterile, your skin needs to be as free of germs as  possible. You can reduce the number of germs on your skin by washing with CHG (chlorahexidine gluconate) Soap before surgery.  CHG is an antiseptic cleaner which kills germs and bonds with the skin to continue killing germs even after washing.    Oral Hygiene is also important to reduce your risk of infection.  Remember - BRUSH YOUR TEETH THE MORNING OF SURGERY WITH YOUR REGULAR TOOTHPASTE  Please do not use if you have an allergy to CHG or antibacterial soaps. If your skin becomes reddened/irritated stop using the CHG.  Do not shave (including legs and underarms) for at least 48 hours prior to first CHG shower. It is OK to shave your face.  Please follow these instructions carefully.   1. Shower the NIGHT BEFORE SURGERY and the MORNING OF SURGERY with CHG.   2. If you chose to wash your hair, wash your hair first as usual with your normal shampoo.  3. After you shampoo, rinse your hair and body thoroughly to remove the shampoo.  4. Use CHG as you would any other liquid soap. You can apply CHG directly to the skin and wash gently with a scrungie or a clean washcloth.   5. Apply the CHG Soap to your body ONLY FROM THE NECK DOWN.  Do not use on open wounds or open sores. Avoid contact with your eyes, ears, mouth and genitals (private parts). Wash Face and genitals (private parts)  with your normal soap.  6.  Wash thoroughly, paying special attention to the area where your surgery will be performed.  7. Thoroughly rinse your body with warm water from the neck down.  8. DO NOT shower/wash with your normal soap after using and rinsing off the CHG Soap.  9. Pat yourself dry with a CLEAN TOWEL.  10. Wear CLEAN PAJAMAS to bed the night before surgery, wear comfortable clothes the morning of surgery  11. Place CLEAN SHEETS on your bed the night of your first shower and DO NOT SLEEP WITH PETS.    Day of Surgery:  Do not apply any deodorants/lotions.  Please wear clean clothes to the  hospital/surgery center.   Remember to brush your teeth WITH YOUR REGULAR TOOTHPASTE.    Please read over the following fact sheets that you were given. Coughing and Deep Breathing, MRSA Information and Surgical Site Infection Prevention

## 2018-08-08 ENCOUNTER — Inpatient Hospital Stay (HOSPITAL_COMMUNITY)
Admission: RE | Admit: 2018-08-08 | Discharge: 2018-08-08 | Disposition: A | Discharge status: Home / Self Care | Payer: Medicaid Other | Source: Ambulatory Visit

## 2018-08-11 ENCOUNTER — Encounter (HOSPITAL_COMMUNITY)
Admission: RE | Admit: 2018-08-11 | Discharge: 2018-08-11 | Disposition: A | Discharge status: Home / Self Care | Payer: Medicaid Other | Source: Ambulatory Visit | Attending: Neurosurgery | Admitting: Neurosurgery

## 2018-08-11 ENCOUNTER — Other Ambulatory Visit: Payer: Self-pay

## 2018-08-11 ENCOUNTER — Encounter (HOSPITAL_COMMUNITY): Payer: Self-pay

## 2018-08-11 DIAGNOSIS — Z01818 Encounter for other preprocedural examination: Secondary | ICD-10-CM | POA: Insufficient documentation

## 2018-08-11 HISTORY — DX: Bipolar disorder, unspecified: F31.9

## 2018-08-11 LAB — CBC WITH DIFFERENTIAL/PLATELET
ABS IMMATURE GRANULOCYTES: 0 10*3/uL (ref 0.0–0.1)
BASOS ABS: 0.1 10*3/uL (ref 0.0–0.1)
BASOS PCT: 1 %
EOS ABS: 0.3 10*3/uL (ref 0.0–0.7)
Eosinophils Relative: 2 %
HCT: 43.9 % (ref 36.0–46.0)
HEMOGLOBIN: 14.3 g/dL (ref 12.0–15.0)
IMMATURE GRANULOCYTES: 0 %
LYMPHS ABS: 3.5 10*3/uL (ref 0.7–4.0)
Lymphocytes Relative: 25 %
MCH: 31.6 pg (ref 26.0–34.0)
MCHC: 32.6 g/dL (ref 30.0–36.0)
MCV: 97.1 fL (ref 78.0–100.0)
MONOS PCT: 9 %
Monocytes Absolute: 1.2 10*3/uL — ABNORMAL HIGH (ref 0.1–1.0)
Neutro Abs: 9.1 10*3/uL — ABNORMAL HIGH (ref 1.7–7.7)
Neutrophils Relative %: 63 %
Platelets: 279 10*3/uL (ref 150–400)
RBC: 4.52 MIL/uL (ref 3.87–5.11)
RDW: 12.9 % (ref 11.5–15.5)
WBC: 14.2 10*3/uL — ABNORMAL HIGH (ref 4.0–10.5)

## 2018-08-11 LAB — COMPREHENSIVE METABOLIC PANEL
ALBUMIN: 3.9 g/dL (ref 3.5–5.0)
ALK PHOS: 61 U/L (ref 38–126)
ALT: 13 U/L (ref 0–44)
AST: 16 U/L (ref 15–41)
Anion gap: 9 (ref 5–15)
BUN: 11 mg/dL (ref 6–20)
CALCIUM: 9 mg/dL (ref 8.9–10.3)
CHLORIDE: 107 mmol/L (ref 98–111)
CO2: 24 mmol/L (ref 22–32)
CREATININE: 0.74 mg/dL (ref 0.44–1.00)
GFR calc Af Amer: >60 mL/min (ref >60)
GFR calc non Af Amer: >60 mL/min (ref >60)
GLUCOSE: 71 mg/dL (ref 70–99)
Potassium: 3.5 mmol/L (ref 3.5–5.1)
SODIUM: 140 mmol/L (ref 135–145)
Total Bilirubin: 0.4 mg/dL (ref 0.3–1.2)
Total Protein: 7.4 g/dL (ref 6.5–8.1)

## 2018-08-11 LAB — SURGICAL PCR SCREEN
MRSA, PCR: POSITIVE — AB
STAPHYLOCOCCUS AUREUS: POSITIVE — AB

## 2018-08-11 MED ORDER — VANCOMYCIN HCL IN DEXTROSE 1-5 GM/200ML-% IV SOLN
1000.0000 mg | INTRAVENOUS | Status: AC
  Administered 2018-08-12: 1000 mg via INTRAVENOUS
  Filled 2018-08-11: qty 200

## 2018-08-11 NOTE — Progress Notes (Signed)
PCP - Lonie Peak PA  EKG - 08/11/18  Blood Thinner Instructions: N/A Aspirin Instructions: N/A  Anesthesia review: none  Patient denies shortness of breath, fever, cough and chest pain at PAT appointment   Patient verbalized understanding of instructions that were given to them at the PAT appointment. Patient was also instructed that they will need to review over the PAT instructions again at home before surgery.

## 2018-08-12 ENCOUNTER — Ambulatory Visit (HOSPITAL_COMMUNITY): Payer: Medicaid Other

## 2018-08-12 ENCOUNTER — Encounter (HOSPITAL_COMMUNITY)
Admission: RE | Disposition: A | Discharge status: Home / Self Care | Payer: Self-pay | Source: Ambulatory Visit | Attending: Neurosurgery

## 2018-08-12 ENCOUNTER — Observation Stay (HOSPITAL_COMMUNITY)
Admission: RE | Admit: 2018-08-12 | Discharge: 2018-08-13 | Disposition: A | Discharge status: Home / Self Care | Payer: Medicaid Other | Source: Ambulatory Visit | Attending: Neurosurgery | Admitting: Neurosurgery

## 2018-08-12 ENCOUNTER — Ambulatory Visit (HOSPITAL_COMMUNITY): Payer: Medicaid Other | Admitting: Anesthesiology

## 2018-08-12 ENCOUNTER — Encounter (HOSPITAL_COMMUNITY): Payer: Self-pay | Admitting: Anesthesiology

## 2018-08-12 DIAGNOSIS — M069 Rheumatoid arthritis, unspecified: Secondary | ICD-10-CM | POA: Diagnosis not present

## 2018-08-12 DIAGNOSIS — D649 Anemia, unspecified: Secondary | ICD-10-CM | POA: Insufficient documentation

## 2018-08-12 DIAGNOSIS — Z809 Family history of malignant neoplasm, unspecified: Secondary | ICD-10-CM | POA: Insufficient documentation

## 2018-08-12 DIAGNOSIS — J45909 Unspecified asthma, uncomplicated: Secondary | ICD-10-CM | POA: Diagnosis not present

## 2018-08-12 DIAGNOSIS — F419 Anxiety disorder, unspecified: Secondary | ICD-10-CM | POA: Diagnosis not present

## 2018-08-12 DIAGNOSIS — M50122 Cervical disc disorder at C5-C6 level with radiculopathy: Secondary | ICD-10-CM | POA: Diagnosis not present

## 2018-08-12 DIAGNOSIS — K219 Gastro-esophageal reflux disease without esophagitis: Secondary | ICD-10-CM | POA: Insufficient documentation

## 2018-08-12 DIAGNOSIS — Z91048 Other nonmedicinal substance allergy status: Secondary | ICD-10-CM | POA: Insufficient documentation

## 2018-08-12 DIAGNOSIS — M419 Scoliosis, unspecified: Secondary | ICD-10-CM | POA: Insufficient documentation

## 2018-08-12 DIAGNOSIS — M4802 Spinal stenosis, cervical region: Secondary | ICD-10-CM | POA: Diagnosis not present

## 2018-08-12 DIAGNOSIS — Z885 Allergy status to narcotic agent status: Secondary | ICD-10-CM | POA: Diagnosis not present

## 2018-08-12 DIAGNOSIS — Z8249 Family history of ischemic heart disease and other diseases of the circulatory system: Secondary | ICD-10-CM | POA: Diagnosis not present

## 2018-08-12 DIAGNOSIS — Z91030 Bee allergy status: Secondary | ICD-10-CM | POA: Diagnosis not present

## 2018-08-12 DIAGNOSIS — M5412 Radiculopathy, cervical region: Secondary | ICD-10-CM | POA: Diagnosis present

## 2018-08-12 DIAGNOSIS — F429 Obsessive-compulsive disorder, unspecified: Secondary | ICD-10-CM | POA: Diagnosis not present

## 2018-08-12 DIAGNOSIS — M549 Dorsalgia, unspecified: Secondary | ICD-10-CM | POA: Diagnosis not present

## 2018-08-12 DIAGNOSIS — F319 Bipolar disorder, unspecified: Secondary | ICD-10-CM | POA: Insufficient documentation

## 2018-08-12 DIAGNOSIS — G8929 Other chronic pain: Secondary | ICD-10-CM | POA: Insufficient documentation

## 2018-08-12 DIAGNOSIS — G4700 Insomnia, unspecified: Secondary | ICD-10-CM | POA: Insufficient documentation

## 2018-08-12 DIAGNOSIS — G5600 Carpal tunnel syndrome, unspecified upper limb: Secondary | ICD-10-CM | POA: Insufficient documentation

## 2018-08-12 DIAGNOSIS — Z96652 Presence of left artificial knee joint: Secondary | ICD-10-CM | POA: Diagnosis not present

## 2018-08-12 DIAGNOSIS — Z818 Family history of other mental and behavioral disorders: Secondary | ICD-10-CM | POA: Insufficient documentation

## 2018-08-12 DIAGNOSIS — M4722 Other spondylosis with radiculopathy, cervical region: Principal | ICD-10-CM | POA: Insufficient documentation

## 2018-08-12 DIAGNOSIS — Z79899 Other long term (current) drug therapy: Secondary | ICD-10-CM | POA: Insufficient documentation

## 2018-08-12 DIAGNOSIS — Z419 Encounter for procedure for purposes other than remedying health state, unspecified: Secondary | ICD-10-CM

## 2018-08-12 HISTORY — PX: ANTERIOR CERVICAL DECOMP/DISCECTOMY FUSION: SHX1161

## 2018-08-12 LAB — POCT PREGNANCY, URINE: Preg Test, Ur: NEGATIVE

## 2018-08-12 SURGERY — ANTERIOR CERVICAL DECOMPRESSION/DISCECTOMY FUSION 2 LEVELS
Anesthesia: General

## 2018-08-12 MED ORDER — THROMBIN 5000 UNITS EX SOLR
CUTANEOUS | Status: DC | PRN
  Administered 2018-08-12: 10000 [IU] via TOPICAL

## 2018-08-12 MED ORDER — ARTIFICIAL TEARS OPHTHALMIC OINT
TOPICAL_OINTMENT | OPHTHALMIC | Status: AC
  Filled 2018-08-12: qty 3.5

## 2018-08-12 MED ORDER — ONDANSETRON HCL 4 MG PO TABS: 4.0000 mg | ORAL_TABLET | Freq: Four times a day (QID) | ORAL | Status: DC | PRN

## 2018-08-12 MED ORDER — PHENYLEPHRINE 40 MCG/ML (10ML) SYRINGE FOR IV PUSH (FOR BLOOD PRESSURE SUPPORT)
PREFILLED_SYRINGE | INTRAVENOUS | Status: AC
  Filled 2018-08-12: qty 10

## 2018-08-12 MED ORDER — CHLORHEXIDINE GLUCONATE CLOTH 2 % EX PADS: 6.0000 | MEDICATED_PAD | Freq: Once | CUTANEOUS | Status: DC

## 2018-08-12 MED ORDER — LIDOCAINE 2% (20 MG/ML) 5 ML SYRINGE
INTRAMUSCULAR | Status: DC | PRN
  Administered 2018-08-12: 100 mg via INTRAVENOUS

## 2018-08-12 MED ORDER — PANTOPRAZOLE SODIUM 40 MG PO TBEC
40.0000 mg | DELAYED_RELEASE_TABLET | Freq: Every day | ORAL | Status: DC
  Administered 2018-08-12: 40 mg via ORAL
  Filled 2018-08-12: qty 1

## 2018-08-12 MED ORDER — SODIUM CHLORIDE 0.9 % IJ SOLN
INTRAMUSCULAR | Status: AC
  Filled 2018-08-12: qty 10

## 2018-08-12 MED ORDER — LACTATED RINGERS IV SOLN: INTRAVENOUS | Status: DC

## 2018-08-12 MED ORDER — SUGAMMADEX SODIUM 200 MG/2ML IV SOLN
INTRAVENOUS | Status: DC | PRN
  Administered 2018-08-12: 200 mg via INTRAVENOUS

## 2018-08-12 MED ORDER — LACTATED RINGERS IV SOLN
INTRAVENOUS | Status: DC
  Administered 2018-08-12: 09:00:00 via INTRAVENOUS

## 2018-08-12 MED ORDER — PHENOL 1.4 % MT LIQD: 1.0000 | OROMUCOSAL | Status: DC | PRN

## 2018-08-12 MED ORDER — OXYCODONE HCL 5 MG PO TABS
10.0000 mg | ORAL_TABLET | ORAL | Status: DC | PRN
  Administered 2018-08-12 – 2018-08-13 (×5): 10 mg via ORAL
  Filled 2018-08-12 (×5): qty 2

## 2018-08-12 MED ORDER — LEVONORGESTREL 20 MCG/24HR IU IUD: 1.0000 | INTRAUTERINE_SYSTEM | INTRAUTERINE | Status: DC

## 2018-08-12 MED ORDER — ROCURONIUM BROMIDE 50 MG/5ML IV SOSY
PREFILLED_SYRINGE | INTRAVENOUS | Status: AC
  Filled 2018-08-12: qty 5

## 2018-08-12 MED ORDER — DIAZEPAM 5 MG PO TABS
5.0000 mg | ORAL_TABLET | Freq: Four times a day (QID) | ORAL | Status: DC | PRN
  Administered 2018-08-12 – 2018-08-13 (×4): 5 mg via ORAL
  Filled 2018-08-12 (×4): qty 1

## 2018-08-12 MED ORDER — MUPIROCIN CALCIUM 2 % NA OINT
1.0000 "application " | TOPICAL_OINTMENT | Freq: Two times a day (BID) | NASAL | Status: DC
  Filled 2018-08-12 (×18): qty 1

## 2018-08-12 MED ORDER — VANCOMYCIN HCL IN DEXTROSE 750-5 MG/150ML-% IV SOLN
750.0000 mg | Freq: Once | INTRAVENOUS | Status: AC
  Administered 2018-08-12: 750 mg via INTRAVENOUS
  Filled 2018-08-12 (×2): qty 150

## 2018-08-12 MED ORDER — HYDROCODONE-ACETAMINOPHEN 5-325 MG PO TABS: 1.0000 | ORAL_TABLET | ORAL | Status: DC | PRN

## 2018-08-12 MED ORDER — OXYCODONE HCL 5 MG PO TABS: 5.0000 mg | ORAL_TABLET | Freq: Four times a day (QID) | ORAL | Status: DC | PRN

## 2018-08-12 MED ORDER — ALBUTEROL SULFATE (2.5 MG/3ML) 0.083% IN NEBU: 3.0000 mL | INHALATION_SOLUTION | Freq: Four times a day (QID) | RESPIRATORY_TRACT | Status: DC | PRN

## 2018-08-12 MED ORDER — FENTANYL CITRATE (PF) 250 MCG/5ML IJ SOLN
INTRAMUSCULAR | Status: AC
  Filled 2018-08-12: qty 5

## 2018-08-12 MED ORDER — THROMBIN 5000 UNITS EX SOLR
CUTANEOUS | Status: AC
  Filled 2018-08-12: qty 15000

## 2018-08-12 MED ORDER — HYDROMORPHONE HCL 1 MG/ML IJ SOLN
1.0000 mg | INTRAMUSCULAR | Status: DC | PRN
  Administered 2018-08-12: 1 mg via INTRAVENOUS
  Filled 2018-08-12: qty 1

## 2018-08-12 MED ORDER — ONDANSETRON HCL 4 MG/2ML IJ SOLN
4.0000 mg | Freq: Four times a day (QID) | INTRAMUSCULAR | Status: DC | PRN
  Administered 2018-08-12: 4 mg via INTRAVENOUS
  Filled 2018-08-12: qty 2

## 2018-08-12 MED ORDER — CYCLOBENZAPRINE HCL 10 MG PO TABS: 10.0000 mg | ORAL_TABLET | Freq: Three times a day (TID) | ORAL | Status: DC | PRN

## 2018-08-12 MED ORDER — MUPIROCIN 2 % EX OINT
TOPICAL_OINTMENT | Freq: Two times a day (BID) | CUTANEOUS | Status: DC
  Administered 2018-08-12: 1 via NASAL
  Administered 2018-08-12: 15:00:00 via NASAL
  Filled 2018-08-12: qty 22

## 2018-08-12 MED ORDER — SUCCINYLCHOLINE CHLORIDE 200 MG/10ML IV SOSY
PREFILLED_SYRINGE | INTRAVENOUS | Status: AC
  Filled 2018-08-12: qty 10

## 2018-08-12 MED ORDER — SODIUM CHLORIDE 0.9% FLUSH: 3.0000 mL | INTRAVENOUS | Status: DC | PRN

## 2018-08-12 MED ORDER — MIDAZOLAM HCL 2 MG/2ML IJ SOLN
INTRAMUSCULAR | Status: AC
  Filled 2018-08-12: qty 2

## 2018-08-12 MED ORDER — CEFAZOLIN SODIUM-DEXTROSE 2-4 GM/100ML-% IV SOLN
2.0000 g | INTRAVENOUS | Status: AC
  Administered 2018-08-12: 2 g via INTRAVENOUS
  Filled 2018-08-12: qty 100

## 2018-08-12 MED ORDER — SODIUM CHLORIDE 0.9 % IV SOLN
INTRAVENOUS | Status: DC | PRN
  Administered 2018-08-12: 10:00:00

## 2018-08-12 MED ORDER — ONDANSETRON HCL 4 MG/2ML IJ SOLN
INTRAMUSCULAR | Status: DC | PRN
  Administered 2018-08-12: 4 mg via INTRAVENOUS

## 2018-08-12 MED ORDER — SODIUM CHLORIDE 0.9 % IV SOLN: 250.0000 mL | INTRAVENOUS | Status: DC

## 2018-08-12 MED ORDER — PROPOFOL 10 MG/ML IV BOLUS
INTRAVENOUS | Status: AC
  Filled 2018-08-12: qty 20

## 2018-08-12 MED ORDER — ACETAMINOPHEN 325 MG PO TABS: 650.0000 mg | ORAL_TABLET | ORAL | Status: DC | PRN

## 2018-08-12 MED ORDER — MIDAZOLAM HCL 5 MG/5ML IJ SOLN
INTRAMUSCULAR | Status: DC | PRN
  Administered 2018-08-12: 2 mg via INTRAVENOUS

## 2018-08-12 MED ORDER — OXYCODONE HCL 5 MG PO TABS: 10.0000 mg | ORAL_TABLET | ORAL | Status: DC | PRN

## 2018-08-12 MED ORDER — PROMETHAZINE HCL 25 MG/ML IJ SOLN: 6.2500 mg | INTRAMUSCULAR | Status: DC | PRN

## 2018-08-12 MED ORDER — PROPOFOL 10 MG/ML IV BOLUS
INTRAVENOUS | Status: DC | PRN
  Administered 2018-08-12: 200 mg via INTRAVENOUS

## 2018-08-12 MED ORDER — GLYCOPYRROLATE PF 0.2 MG/ML IJ SOSY
PREFILLED_SYRINGE | INTRAMUSCULAR | Status: AC
  Filled 2018-08-12: qty 1

## 2018-08-12 MED ORDER — FENTANYL CITRATE (PF) 100 MCG/2ML IJ SOLN
INTRAMUSCULAR | Status: DC | PRN
  Administered 2018-08-12: 150 ug via INTRAVENOUS
  Administered 2018-08-12 (×3): 100 ug via INTRAVENOUS

## 2018-08-12 MED ORDER — ACETAMINOPHEN 650 MG RE SUPP: 650.0000 mg | RECTAL | Status: DC | PRN

## 2018-08-12 MED ORDER — 0.9 % SODIUM CHLORIDE (POUR BTL) OPTIME
TOPICAL | Status: DC | PRN
  Administered 2018-08-12: 1000 mL

## 2018-08-12 MED ORDER — HEMOSTATIC AGENTS (NO CHARGE) OPTIME
TOPICAL | Status: DC | PRN
  Administered 2018-08-12: 1 via TOPICAL

## 2018-08-12 MED ORDER — MEPERIDINE HCL 50 MG/ML IJ SOLN: 6.2500 mg | INTRAMUSCULAR | Status: DC | PRN

## 2018-08-12 MED ORDER — HYDROMORPHONE HCL 1 MG/ML IJ SOLN
0.2500 mg | INTRAMUSCULAR | Status: DC | PRN
  Administered 2018-08-12 (×2): 0.5 mg via INTRAVENOUS

## 2018-08-12 MED ORDER — HYDROMORPHONE HCL 1 MG/ML IJ SOLN
INTRAMUSCULAR | Status: AC
  Filled 2018-08-12: qty 1

## 2018-08-12 MED ORDER — EPHEDRINE 5 MG/ML INJ
INTRAVENOUS | Status: AC
  Filled 2018-08-12: qty 10

## 2018-08-12 MED ORDER — DEXMEDETOMIDINE HCL 200 MCG/2ML IV SOLN
INTRAVENOUS | Status: DC | PRN
  Administered 2018-08-12 (×2): 8 ug via INTRAVENOUS
  Administered 2018-08-12: 12 ug via INTRAVENOUS

## 2018-08-12 MED ORDER — NEOSTIGMINE METHYLSULFATE 3 MG/3ML IV SOSY
PREFILLED_SYRINGE | INTRAVENOUS | Status: AC
  Filled 2018-08-12: qty 3

## 2018-08-12 MED ORDER — VITAMIN C 500 MG PO TABS
500.0000 mg | ORAL_TABLET | Freq: Every day | ORAL | Status: DC
  Administered 2018-08-12: 500 mg via ORAL
  Filled 2018-08-12: qty 1

## 2018-08-12 MED ORDER — ROCURONIUM BROMIDE 100 MG/10ML IV SOLN
INTRAVENOUS | Status: DC | PRN
  Administered 2018-08-12: 50 mg via INTRAVENOUS

## 2018-08-12 MED ORDER — MENTHOL 3 MG MT LOZG
1.0000 | LOZENGE | OROMUCOSAL | Status: DC | PRN
  Administered 2018-08-13: 3 mg via ORAL
  Filled 2018-08-12: qty 9

## 2018-08-12 MED ORDER — DEXAMETHASONE SODIUM PHOSPHATE 10 MG/ML IJ SOLN
10.0000 mg | INTRAMUSCULAR | Status: DC
  Filled 2018-08-12: qty 1

## 2018-08-12 MED ORDER — DEXAMETHASONE SODIUM PHOSPHATE 4 MG/ML IJ SOLN
INTRAMUSCULAR | Status: DC | PRN
  Administered 2018-08-12: 10 mg via INTRAVENOUS

## 2018-08-12 MED ORDER — LIDOCAINE 2% (20 MG/ML) 5 ML SYRINGE
INTRAMUSCULAR | Status: AC
  Filled 2018-08-12: qty 5

## 2018-08-12 MED ORDER — QUETIAPINE FUMARATE 200 MG PO TABS
200.0000 mg | ORAL_TABLET | Freq: Every day | ORAL | Status: DC
  Administered 2018-08-12: 200 mg via ORAL
  Filled 2018-08-12: qty 1

## 2018-08-12 MED ORDER — SODIUM CHLORIDE 0.9% FLUSH
3.0000 mL | Freq: Two times a day (BID) | INTRAVENOUS | Status: DC
  Administered 2018-08-12 (×2): 3 mL via INTRAVENOUS

## 2018-08-12 MED ORDER — HYDROCODONE-ACETAMINOPHEN 10-325 MG PO TABS
2.0000 | ORAL_TABLET | ORAL | Status: DC | PRN
  Administered 2018-08-13: 2 via ORAL
  Filled 2018-08-12: qty 2

## 2018-08-12 MED ORDER — ONDANSETRON HCL 4 MG/2ML IJ SOLN
INTRAMUSCULAR | Status: AC
  Filled 2018-08-12: qty 2

## 2018-08-12 SURGICAL SUPPLY — 59 items
APL SKNCLS STERI-STRIP NONHPOA (GAUZE/BANDAGES/DRESSINGS) ×1
BAG DECANTER FOR FLEXI CONT (MISCELLANEOUS) ×3 IMPLANT
BENZOIN TINCTURE PRP APPL 2/3 (GAUZE/BANDAGES/DRESSINGS) ×3 IMPLANT
BIT DRILL 13 (BIT) ×1 IMPLANT
BIT DRILL 13MM (BIT) ×1
BUR MATCHSTICK NEURO 3.0 LAGG (BURR) ×3 IMPLANT
CAGE PEEK 7X14X11 (Cage) ×6 IMPLANT
CANISTER SUCT 3000ML PPV (MISCELLANEOUS) ×3 IMPLANT
CARTRIDGE OIL MAESTRO DRILL (MISCELLANEOUS) ×1 IMPLANT
CLOSURE WOUND 1/2 X4 (GAUZE/BANDAGES/DRESSINGS) ×1
COVER WAND RF STERILE (DRAPES) ×3 IMPLANT
DIFFUSER DRILL AIR PNEUMATIC (MISCELLANEOUS) ×3 IMPLANT
DRAPE C-ARM 42X72 X-RAY (DRAPES) ×6 IMPLANT
DRAPE LAPAROTOMY 100X72 PEDS (DRAPES) ×3 IMPLANT
DRAPE MICROSCOPE LEICA (MISCELLANEOUS) ×3 IMPLANT
DURAPREP 6ML APPLICATOR 50/CS (WOUND CARE) ×3 IMPLANT
ELECT COATED BLADE 2.86 ST (ELECTRODE) ×3 IMPLANT
ELECT REM PT RETURN 9FT ADLT (ELECTROSURGICAL) ×3
ELECTRODE REM PT RTRN 9FT ADLT (ELECTROSURGICAL) ×1 IMPLANT
GAUZE 4X4 16PLY RFD (DISPOSABLE) IMPLANT
GAUZE SPONGE 4X4 12PLY STRL (GAUZE/BANDAGES/DRESSINGS) ×3 IMPLANT
GAUZE SPONGE 4X4 12PLY STRL LF (GAUZE/BANDAGES/DRESSINGS) ×2 IMPLANT
GLOVE ECLIPSE 9.0 STRL (GLOVE) ×3 IMPLANT
GLOVE EXAM NITRILE LRG STRL (GLOVE) IMPLANT
GLOVE EXAM NITRILE XL STR (GLOVE) IMPLANT
GLOVE EXAM NITRILE XS STR PU (GLOVE) IMPLANT
GOWN STRL REUS W/ TWL LRG LVL3 (GOWN DISPOSABLE) IMPLANT
GOWN STRL REUS W/ TWL XL LVL3 (GOWN DISPOSABLE) ×1 IMPLANT
GOWN STRL REUS W/TWL 2XL LVL3 (GOWN DISPOSABLE) IMPLANT
GOWN STRL REUS W/TWL LRG LVL3 (GOWN DISPOSABLE)
GOWN STRL REUS W/TWL XL LVL3 (GOWN DISPOSABLE) ×3
HALTER HD/CHIN CERV TRACTION D (MISCELLANEOUS) ×3 IMPLANT
HEMOSTAT POWDER KIT SURGIFOAM (HEMOSTASIS) IMPLANT
HEMOSTAT SURGICEL 2X14 (HEMOSTASIS) IMPLANT
KIT BASIN OR (CUSTOM PROCEDURE TRAY) ×3 IMPLANT
KIT TURNOVER KIT B (KITS) ×3 IMPLANT
NDL SPNL 20GX3.5 QUINCKE YW (NEEDLE) ×1 IMPLANT
NEEDLE SPNL 20GX3.5 QUINCKE YW (NEEDLE) ×3 IMPLANT
NS IRRIG 1000ML POUR BTL (IV SOLUTION) ×3 IMPLANT
OIL CARTRIDGE MAESTRO DRILL (MISCELLANEOUS) ×3
PACK LAMINECTOMY NEURO (CUSTOM PROCEDURE TRAY) ×3 IMPLANT
PAD ARMBOARD 7.5X6 YLW CONV (MISCELLANEOUS) ×9 IMPLANT
PLATE ELITE 42MM (Plate) ×2 IMPLANT
RUBBERBAND STERILE (MISCELLANEOUS) ×6 IMPLANT
SCREW ST 13X4XST VA NS SPNE (Screw) IMPLANT
SCREW ST VAR 4 ATL (Screw) ×18 IMPLANT
SPACER SPNL 11X14X7XPEEK CVD (Cage) IMPLANT
SPCR SPNL 11X14X7XPEEK CVD (Cage) ×2 IMPLANT
SPONGE INTESTINAL PEANUT (DISPOSABLE) ×3 IMPLANT
SPONGE SURGIFOAM ABS GEL SZ50 (HEMOSTASIS) ×3 IMPLANT
STRIP CLOSURE SKIN 1/2X4 (GAUZE/BANDAGES/DRESSINGS) ×2 IMPLANT
SUT VIC AB 3-0 SH 8-18 (SUTURE) ×3 IMPLANT
SUT VIC AB 4-0 RB1 18 (SUTURE) ×3 IMPLANT
TAPE CLOTH 4X10 WHT NS (GAUZE/BANDAGES/DRESSINGS) ×3 IMPLANT
TAPE CLOTH SURG 4X10 WHT LF (GAUZE/BANDAGES/DRESSINGS) ×2 IMPLANT
TOWEL GREEN STERILE (TOWEL DISPOSABLE) ×3 IMPLANT
TOWEL GREEN STERILE FF (TOWEL DISPOSABLE) ×3 IMPLANT
TRAP SPECIMEN MUCOUS 40CC (MISCELLANEOUS) ×3 IMPLANT
WATER STERILE IRR 1000ML POUR (IV SOLUTION) ×3 IMPLANT

## 2018-08-12 NOTE — Progress Notes (Signed)
Pharmacy Antibiotic Note  Jenna Trevino is a 38 y.o. 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 Patient is s/p Anterior Cervical Decompression/ Discectomy Fusion. Pharmacy has been consulted for Vancomycin  dosing for surgical prophylaxis. No drains noted.   Plan: Vancomycin 750mg  IV x 1 (12 hours from pre-op dose)     Temp (24hrs), Avg:97.6 F (36.4 C), Min:97.3 F (36.3 C), Max:98.2 F (36.8 C)  Recent Labs  Lab 08/11/18 0929  WBC 14.2*  CREATININE 0.74    Estimated Creatinine Clearance: 111.1 mL/min (by C-G formula based on SCr of 0.74 mg/dL).    Allergies  Allergen Reactions  . Bee Venom Anaphylaxis  . Tobacco [Nicotiana Tabacum] Anaphylaxis  . Percocet [Oxycodone-Acetaminophen] Itching and Rash   Pharmacy will sign off.   Devron Cohick A. 10/11/18, PharmD, BCPS Clinical Pharmacist [place] Pager: [phone] Please utilize Amion for appropriate phone number to reach the unit pharmacist [facility])    08/12/2018 2:01 PM

## 2018-08-12 NOTE — Evaluation (Signed)
Occupational Therapy Evaluation and Discharge Patient Details Name: JAMIRIA LANGILL MRN: [id number] DOB: 05-Dec-1980 Today's Date: 08/12/2018    History of Present Illness Pt is a 37 yo female s/p C5-6, C6-7 anterior cervical discectomy with interbody fusion. Pt had a PLIF L4-5, L5-S1 in feb 2017   Clinical Impression   This 37 yo female admitted and underwent above presents to acute OT with all education completed, we will D/C from acute OT.    Follow Up Recommendations  No OT follow up;Supervision - Intermittent    Equipment Recommendations  None recommended by OT       Precautions / Restrictions Precautions Precautions: Cervical Precaution Booklet Issued: Yes (comment) Required Braces or Orthoses: Cervical Brace Cervical Brace: Soft collar(can have off for eating, bathing, dressing, and brushing teeth) Restrictions Weight Bearing Restrictions: No      Mobility Bed Mobility Overal bed mobility: Modified Independent             General bed mobility comments: Pt could not lay flat to practice OOB (but she is aware of technique from prior back surgery), pt will sleep in recliner for now  Transfers Overall transfer level: Independent                    Balance Overall balance assessment: Independent                                         ADL either performed or assessed with clinical judgement   ADL                                         General ADL Comments: Mod I; educated on use of 2 cups for brushing teeth (one to spit and one rinse with straw), not bending foreward to do LB clothing or to pick items up off of floor, when can have collar off, how to protect collar if she wants to wear it with eating and brushing teeth     Vision Patient Visual Report: No change from baseline              Pertinent Vitals/Pain Pain Assessment: 0-10 Pain Score: 3  Pain Location: incisional pain Pain Descriptors / Indicators:  Aching;Sore Pain Intervention(s): Limited activity within patient's tolerance;Monitored during session;Ice applied     Hand Dominance Right   Extremity/Trunk Assessment Upper Extremity Assessment Upper Extremity Assessment: Overall WFL for tasks assessed           Communication Communication Communication: No difficulties   Cognition Arousal/Alertness: Awake/alert Behavior During Therapy: WFL for tasks assessed/performed Overall Cognitive Status: Within Functional Limits for tasks assessed                                                Home Living Family/patient expects to be discharged to:: Private residence Living Arrangements: Children;Parent Available Help at Discharge: Family;Available 24 hours/day Type of Home: House Home Access: Ramped entrance     Home Layout: One level     Bathroom Shower/Tub: Tub/shower unit;Curtain   Firefighter: Standard     Home Equipment: Environmental consultant - 2 wheels;Bedside commode;Shower seat;Grab bars - tub/shower;Hand held shower head  Prior Functioning/Environment Level of Independence: Independent                 OT Problem List: Pain         OT Goals(Current goals can be found in the care plan section) Acute Rehab OT Goals Patient Stated Goal: home tomorrow  OT Frequency:                AM-PAC PT "6 Clicks" Daily Activity     Outcome Measure Help from another person eating meals?: None Help from another person taking care of personal grooming?: None Help from another person toileting, which includes using toliet, bedpan, or urinal?: None Help from another person bathing (including washing, rinsing, drying)?: None Help from another person to put on and taking off regular upper body clothing?: None Help from another person to put on and taking off regular lower body clothing?: None 6 Click Score: 24   End of Session Equipment Utilized During Treatment: Cervical collar  Activity  Tolerance: Patient tolerated treatment well Patient left: in bed  OT Visit Diagnosis: Pain Pain - part of body: (incisional)                Time: 6812-7517 OT Time Calculation (min): 23 min Charges:  OT General Charges $OT Visit: 1 Visit OT Evaluation $OT Eval Moderate Complexity: 1 Mod  Ignacia Palma, OTR/L Acute [employer] [phone] Office [phone]

## 2018-08-12 NOTE — H&P (Signed)
Jenna Trevino is an 37 y.o. female.   Chief Complaint: Neck pain HPI: 37 year old female with chronic and progressive neck pain with radiation into both upper extremities left greater than right.  Work-up demonstrates evidence of significant spondylosis with associated disc protrusion and early spinal stenosis and marked foraminal stenosis at both C5-6 and C6-7.  Patient is failed conservative management.  She presents now for 2 level anterior cervical decompression and fusion in hopes of improving her symptoms.  Past Medical History:  Diagnosis Date  . Anemia    not on any meds  . Anxiety    doesn't take any meds  . Arthritis    RA in back, Knees, ankles and wrist bilateral  . Asthma    inhaler  . Bipolar disorder (HCC)   . Carpal tunnel syndrome    bilateral  . Chronic back pain    spondylolisthesis.Scoliosis  . Depression    doesn't take any meds  . Diverticulosis   . GERD (gastroesophageal reflux disease)    happens occasionally and will take OTC meds  . History of migraine    last one on 12/07/15  . Insomnia    doesn't take any meds  . Joint pain   . Obsessive compulsive disorder   . Panic attacks    doesn't take any meds    Past Surgical History:  Procedure Laterality Date  . ANTERIOR CRUCIATE LIGAMENT REPAIR Left   . BACK SURGERY    . COLONOSCOPY    . DILATION AND CURETTAGE OF UTERUS     2007  . DILATION AND EVACUATION  2007  . TOOTH EXTRACTION    . TOTAL KNEE ARTHROPLASTY Left     Family History  Problem Relation Age of Onset  . ADD / ADHD Son   . Heart disease Mother   . Cancer Maternal Aunt   . Diabetes Maternal Aunt   . Cancer Maternal Uncle    Social History:  reports that she has never smoked. She has never used smokeless tobacco. She reports that she has current or past drug history. Drug: Marijuana. She reports that she does not drink alcohol.  Allergies:  Allergies  Allergen Reactions  . Bee Venom Anaphylaxis  . Tobacco [Nicotiana  Tabacum] Anaphylaxis  . Percocet [Oxycodone-Acetaminophen] Itching and Rash    Medications Prior to Admission  Medication Sig Dispense Refill  . acetaminophen (TYLENOL) 500 MG tablet Take 500 mg by mouth every 6 (six) hours as needed for moderate pain.    Marland Kitchen albuterol (PROVENTIL HFA;VENTOLIN HFA) 108 (90 BASE) MCG/ACT inhaler Inhale 2 puffs into the lungs every 6 (six) hours as needed for wheezing or shortness of breath.    . Biotin w/ Vitamins C & E (HAIR/SKIN/NAILS PO) Take 1 tablet by mouth daily.    Marland Kitchen levonorgestrel (MIRENA) 20 MCG/24HR IUD 1 each by Intrauterine route continuous.    . mupirocin nasal ointment (BACTROBAN) 2 % Place 1 application into the nose daily as needed (dry nose (interior)). Use one-half of tube in each nostril twice daily for five (5) days. After application, press sides of nose together and gently massage.    Marland Kitchen oxyCODONE (OXY IR/ROXICODONE) 5 MG immediate release tablet Take 5 mg by mouth every 6 (six) hours as needed for pain.  0  . QUEtiapine (SEROQUEL) 200 MG tablet Take 200 mg by mouth at bedtime.  0  . vitamin C (ASCORBIC ACID) 250 MG tablet Take 500 mg by mouth daily.    . diazepam (VALIUM) 5  MG tablet Take 1-2 tablets (5-10 mg total) by mouth every 6 (six) hours as needed for muscle spasms. (Patient not taking: Reported on 08/02/2018) 60 tablet 0  . ibuprofen (ADVIL,MOTRIN) 600 MG tablet Take 1 tablet (600 mg total) by mouth every 6 (six) hours. (Patient not taking: Reported on 08/02/2018) 40 tablet 0  . omeprazole (PRILOSEC) 20 MG capsule Take 20 mg by mouth daily as needed (heartburn).    Marland Kitchen oxyCODONE (ROXICODONE) 15 MG immediate release tablet Take 1-2 tablets (15-30 mg total) by mouth every 4 (four) hours as needed for pain. (Patient not taking: Reported on 08/02/2018) 90 tablet 0    Results for orders placed or performed during the hospital encounter of 08/12/18 (from the past 48 hour(s))  Pregnancy, urine POC     Status: None   Collection Time: 08/12/18   7:54 AM  Result Value Ref Range   Preg Test, Ur NEGATIVE NEGATIVE    Comment:        THE SENSITIVITY OF THIS METHODOLOGY IS >24 mIU/mL    No results found.  Pertinent items noted in HPI and remainder of comprehensive ROS otherwise negative.  Blood pressure 118/86, pulse 87, temperature (!) 97.5 F (36.4 C), temperature source Oral, resp. rate 18, SpO2 100 %.  She is awake and alert.  She is oriented and appropriate.  Speech is fluent.  Judgment and insight are intact.  Cranial nerve function normal bilateral.  Motor examination intact bilateral.  Sensory examination is mild decreased sensation pinprick light touch near C6 and C7 dermatome on the left.  Deep tendon reflexes normal active.  No evidence of long track signs.  Gait and posture normal.  Examination head ears eyes nose throat is unremarkable her chest and abdomen are benign.  Extremities are free from injury deformity. 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  Plan C5-6, C6-7 anterior cervical discectomy with interbody fusion utilizing interbody peek cages, locally harvested autograft, and anterior plate instrumentation.  Risks and benefits of been explained.  Patient wishes to proceed.  Khyli Swaim A Shanesha Bednarz 08/12/2018, 9:17 AM

## 2018-08-12 NOTE — Anesthesia Postprocedure Evaluation (Signed)
Anesthesia Post Note  Patient: Jenna Trevino  Procedure(s) Performed: Anterior Cervical Decompression/ Discectomy Fusion - Cervical Five-Cervical Six - Cervical Six-Cervical Seven (N/A )     Patient location during evaluation: PACU Anesthesia Type: General Level of consciousness: awake and alert Pain management: pain level controlled Vital Signs Assessment: post-procedure vital signs reviewed and stable Respiratory status: spontaneous breathing, nonlabored ventilation and respiratory function stable Cardiovascular status: blood pressure returned to baseline and stable Postop Assessment: no apparent nausea or vomiting Anesthetic complications: no    Last Vitals:  Vitals:   08/12/18 1227 08/12/18 1230  BP: 129/79   Pulse: 62 63  Resp: (!) 9 10  Temp:    SpO2: 96% 97%    Last Pain:  Vitals:   08/12/18 1230  TempSrc:   PainSc: Asleep                 Lowella Curb

## 2018-08-12 NOTE — Anesthesia Preprocedure Evaluation (Signed)
Anesthesia Evaluation  Patient identified by MRN, date of birth, ID band Patient awake    Reviewed: Allergy & Precautions, NPO status , Patient's Chart, lab work & pertinent test results  Airway Mallampati: I  TM Distance: >3 FB Neck ROM: Full    Dental no notable dental hx. (+) Teeth Intact, Dental Advisory Given   Pulmonary asthma ,    Pulmonary exam normal breath sounds clear to auscultation       Cardiovascular Normal cardiovascular exam Rhythm:Regular Rate:Normal     Neuro/Psych Anxiety Depression Bipolar Disorder    GI/Hepatic GERD  Medicated and Controlled,  Endo/Other    Renal/GU      Musculoskeletal  (+) Arthritis , Osteoarthritis,    Abdominal   Peds  Hematology   Anesthesia Other Findings   Reproductive/Obstetrics                             Anesthesia Physical  Anesthesia Plan  ASA: II  Anesthesia Plan: General   Post-op Pain Management:    Induction: Intravenous  PONV Risk Score and Plan: 3 and Ondansetron, Dexamethasone and Midazolam  Airway Management Planned: Oral ETT  Additional Equipment:   Intra-op Plan:   Post-operative Plan: Extubation in OR  Informed Consent: I have reviewed the patients History and Physical, chart, labs and discussed the procedure including the risks, benefits and alternatives for the proposed anesthesia with the patient or authorized representative who has indicated his/her understanding and acceptance.     Plan Discussed with: CRNA and Surgeon  Anesthesia Plan Comments:         Anesthesia Quick Evaluation

## 2018-08-12 NOTE — Transfer of Care (Signed)
Immediate Anesthesia Transfer of Care Note  Patient: Jenna Trevino  Procedure(s) Performed: Anterior Cervical Decompression/ Discectomy Fusion - Cervical Five-Cervical Six - Cervical Six-Cervical Seven (N/A )  Patient Location: PACU  Anesthesia Type:General  Level of Consciousness: awake  Airway & Oxygen Therapy: Patient Spontanous Breathing and Patient connected to face mask oxygen  Post-op Assessment: Report given to RN and Post -op Vital signs reviewed and stable  Post vital signs: Reviewed and stable  Last Vitals:  Vitals Value Taken Time  BP 151/98 08/12/2018 11:27 AM  Temp    Pulse 80 08/12/2018 11:28 AM  Resp 11 08/12/2018 11:28 AM  SpO2 100 % 08/12/2018 11:28 AM  Vitals shown include unvalidated device data.  Last Pain:  Vitals:   08/12/18 0808  TempSrc:   PainSc: 8       Patients Stated Pain Goal: 5 (08/12/18 2458)  Complications: No apparent anesthesia complications

## 2018-08-12 NOTE — Op Note (Signed)
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  Asst. Surgeon: Doran Durand, NP  Anesthesia: General  Indication: 37 year old female with chronic and progressive neck and upper extremity pain failing conservative management.  Work-up demonstrates evidence of significant disc degeneration with disc space collapse, kyphotic angulation, and significant spondylosis with foraminal stenosis.  Patient has failed conservative management presents now for 2 level anterior cervical decompression and fusion in hopes of improving her symptoms.  Operative note: After induction anesthesia, patient position supine with neck slightly extended and held in place of Holter traction.  Patient's anterior cervical region prepped and draped sterilely.  Incision made overlying C6.  Dissection performed on the right.  Retractor placed.  Fluoroscopy used.  Levels confirmed.  The space incised at both levels.  Discectomy was performed using various instruments down to level the posterior annulus.  Microscope was then brought to the field used throughout the remainder of the discectomy.  Remaining aspects of annulus and osteophytes removed using high-speed drill down to level posterior longitudinal ligament.  Posterior logical limb was then elevated and resected piecemeal fashion.  Underlying thecal sac was identified.  Wide central decompression was then performed undercutting the bodies of C5 and C6.  Decompression then proceeded into each neural foramen.  Wide anterior foraminotomies were performed on the course exiting C6 nerve roots bilaterally.  At this point a very thorough decompression had been  achieved.  There was no evidence of injury to the thecal sac or nerve roots.  Procedure was then repeated at C6-7 again without complications.  Wound was then irrigated fanlike solution.  Gelfoam was placed topically for hemostasis then removed.  [employer] anatomic peek cages packed with locally harvested autograft were then impacted into place at both levels.  Each cage was recessed slightly from the anterior cortical margin.  [employer] anterior cervical plate was then placed over the C5, C6 and C7 levels.  This is then attached under fluoroscopic guidance using 13 mm variable angle screws to each of both levels.  All 6 screws given final tightening found to be solidly within the bone.  Locking screws engaged all 3 levels.  Final images reveal good position of the cages and the hardware at the proper operative level with normal alignment of spine.  Wound was then irrigated one final time.  Hemostasis was assured.  Wounds and closed in a typical fashion.  Steri-Strips and sterile dressing were applied.  No apparent complications.  Patient tolerated the procedure well and she returns to the recovery room postop

## 2018-08-12 NOTE — Progress Notes (Signed)
Orthopedic Tech Progress Note Patient Details:  Jenna Trevino 1981-02-10 [id number]  Ortho Devices Type of Ortho Device: Soft collar Ortho Device/Splint Location: neck Ortho Device/Splint Interventions: Application   Post Interventions Patient Tolerated: Well Instructions Provided: Care of device   Nikki Dom 08/12/2018, 12:18 PM

## 2018-08-12 NOTE — Anesthesia Procedure Notes (Signed)
Procedure Name: Intubation Date/Time: 08/12/2018 9:36 AM Performed by: Caren Macadam, CRNA Pre-anesthesia Checklist: Patient identified, Emergency Drugs available, Suction available and Patient being monitored Patient Re-evaluated:Patient Re-evaluated prior to induction Oxygen Delivery Method: Circle system utilized Preoxygenation: Pre-oxygenation with 100% oxygen Induction Type: IV induction Ventilation: Mask ventilation without difficulty Laryngoscope Size: Miller and 2 Grade View: Grade I Tube type: Oral Tube size: 7.0 mm Number of attempts: 1 Airway Equipment and Method: Stylet and Oral airway Placement Confirmation: ETT inserted through vocal cords under direct vision,  positive ETCO2 and breath sounds checked- equal and bilateral Secured at: 21 cm Tube secured with: Tape Dental Injury: Teeth and Oropharynx as per pre-operative assessment

## 2018-08-12 NOTE — Brief Op Note (Signed)
08/12/2018  11:24 AM  PATIENT:  Jenna Trevino  37 y.o. female  PRE-OPERATIVE DIAGNOSIS:  Stenosis  POST-OPERATIVE DIAGNOSIS:  Stenosis  PROCEDURE:  Procedure(s) with comments: Anterior Cervical Decompression/ Discectomy Fusion - Cervical Five-Cervical Six - Cervical Six-Cervical Seven (N/A) - Anterior Cervical Decompression/ Discectomy Fusion - Cervical Five-Cervical Six - Cervical Six-Cervical Seven  SURGEON:  Surgeon(s) and Role:    * Julio Sicks, MD - Primary  PHYSICIAN ASSISTANT:   ASSISTANTSDoran Durand, NP   ANESTHESIA:   general  EBL:  100 mL   BLOOD ADMINISTERED:none  DRAINS: none   LOCAL MEDICATIONS USED:  NONE  SPECIMEN:  No Specimen  DISPOSITION OF SPECIMEN:  N/A  COUNTS:  YES  TOURNIQUET:  * No tourniquets in log *  DICTATION: .Dragon Dictation  PLAN OF CARE: Admit to inpatient   PATIENT DISPOSITION:  PACU - hemodynamically stable.   Delay start of Pharmacological VTE agent (>24hrs) due to surgical blood loss or risk of bleeding: yes

## 2018-08-13 DIAGNOSIS — M4722 Other spondylosis with radiculopathy, cervical region: Secondary | ICD-10-CM | POA: Diagnosis not present

## 2018-08-13 MED ORDER — HYDROCODONE-ACETAMINOPHEN 10-325 MG PO TABS: 2.0000 | ORAL_TABLET | ORAL | 0 refills | Status: DC | PRN

## 2018-08-13 MED ORDER — CYCLOBENZAPRINE HCL 10 MG PO TABS: 10.0000 mg | ORAL_TABLET | Freq: Three times a day (TID) | ORAL | 0 refills | Status: DC | PRN

## 2018-08-13 NOTE — Progress Notes (Signed)
Patient A/Ox4.  Has been ambulating to the bathroom well.  Dressing to cervical incision CDI.  Was seen by Dr. Wynetta Emery this morning--- pt was given discharge instructions and prescription.  Pt is discharged home in stable condition, accompanied by family; pt's belongings sent with family.

## 2018-08-13 NOTE — Discharge Summary (Signed)
  Physician Discharge Summary  Patient ID: Jenna Trevino MRN: [id number] DOB/AGE: 12-Mar-1981 37 y.o. Estimated body mass index is 23.53 kg/m as calculated from the following:   Height as of 08/11/18: 6' (1.829 m).   Weight as of 08/11/18: 78.7 kg.   Admit date: 08/12/2018 Discharge date: 08/13/2018  Admission Diagnoses: Cervical spondylosis with stenosis  Discharge Diagnoses: Same Active Problems:   Cervical radiculopathy   Discharged Condition: good  Hospital Course: Patient doing well significant improvement preoperative arm pain swallowing manageable neck pain manageable patient will be discharged home with scheduled follow-up 1 to 2 weeks.  Consults: Significant Diagnostic Studies: Treatments: ACDF discharge Exam: Blood pressure 112/67, pulse (!) 58, temperature 97.8 F (36.6 C), temperature source Oral, resp. rate 18, SpO2 100 %. Strength 5 out of 5 wound clean dry and intact  Disposition: Home   Allergies as of 08/13/2018      Reactions   Bee Venom Anaphylaxis   Tobacco [nicotiana Tabacum] Anaphylaxis   Percocet [oxycodone-acetaminophen] Itching, Rash      Medication List    TAKE these medications   acetaminophen 500 MG tablet Commonly known as:  TYLENOL Take 500 mg by mouth every 6 (six) hours as needed for moderate pain.   albuterol 108 (90 Base) MCG/ACT inhaler Commonly known as:  PROVENTIL HFA;VENTOLIN HFA Inhale 2 puffs into the lungs every 6 (six) hours as needed for wheezing or shortness of breath.   cyclobenzaprine 10 MG tablet Commonly known as:  FLEXERIL Take 1 tablet (10 mg total) by mouth 3 (three) times daily as needed for muscle spasms.   diazepam 5 MG tablet Commonly known as:  VALIUM Take 1-2 tablets (5-10 mg total) by mouth every 6 (six) hours as needed for muscle spasms.   HAIR/SKIN/NAILS PO Take 1 tablet by mouth daily.   HYDROcodone-acetaminophen 10-325 MG tablet Commonly known as:  NORCO Take 2 tablets by mouth every 4 (four)  hours as needed for severe pain ((score 7 to 10)).   ibuprofen 600 MG tablet Commonly known as:  ADVIL,MOTRIN Take 1 tablet (600 mg total) by mouth every 6 (six) hours.   levonorgestrel 20 MCG/24HR IUD Commonly known as:  MIRENA 1 each by Intrauterine route continuous.   mupirocin nasal ointment 2 % Commonly known as:  BACTROBAN Place 1 application into the nose daily as needed (dry nose (interior)). Use one-half of tube in each nostril twice daily for five (5) days. After application, press sides of nose together and gently massage.   omeprazole 20 MG capsule Commonly known as:  PRILOSEC Take 20 mg by mouth daily as needed (heartburn).   oxyCODONE 15 MG immediate release tablet Commonly known as:  ROXICODONE Take 1-2 tablets (15-30 mg total) by mouth every 4 (four) hours as needed for pain.   oxyCODONE 5 MG immediate release tablet Commonly known as:  Oxy IR/ROXICODONE Take 5 mg by mouth every 6 (six) hours as needed for pain.   QUEtiapine 200 MG tablet Commonly known as:  SEROQUEL Take 200 mg by mouth at bedtime.   vitamin C 250 MG tablet Commonly known as:  ASCORBIC ACID Take 500 mg by mouth daily.        Signed: Maite Burlison P 08/13/2018, 6:41 AM

## 2018-08-15 ENCOUNTER — Encounter (HOSPITAL_COMMUNITY): Payer: Self-pay | Admitting: Neurosurgery

## 2018-11-08 ENCOUNTER — Telehealth: Payer: Self-pay | Admitting: *Deleted

## 2018-11-08 NOTE — Telephone Encounter (Signed)
REFERRAL SENT TO SCHEDULING AND NOTES ON FILE.. °

## 2019-03-01 ENCOUNTER — Telehealth (INDEPENDENT_AMBULATORY_CARE_PROVIDER_SITE_OTHER): Payer: Medicaid Other | Admitting: Gastroenterology

## 2019-03-01 ENCOUNTER — Telehealth: Payer: Self-pay | Admitting: *Deleted

## 2019-03-01 ENCOUNTER — Encounter: Payer: Self-pay | Admitting: Gastroenterology

## 2019-03-01 ENCOUNTER — Other Ambulatory Visit: Payer: Self-pay

## 2019-03-01 VITALS — Ht 72.0 in | Wt 173.0 lb

## 2019-03-01 DIAGNOSIS — R112 Nausea with vomiting, unspecified: Secondary | ICD-10-CM

## 2019-03-01 DIAGNOSIS — Z87898 Personal history of other specified conditions: Secondary | ICD-10-CM

## 2019-03-01 DIAGNOSIS — K219 Gastro-esophageal reflux disease without esophagitis: Secondary | ICD-10-CM

## 2019-03-01 DIAGNOSIS — F1291 Cannabis use, unspecified, in remission: Secondary | ICD-10-CM

## 2019-03-01 DIAGNOSIS — R1030 Lower abdominal pain, unspecified: Secondary | ICD-10-CM

## 2019-03-01 DIAGNOSIS — K581 Irritable bowel syndrome with constipation: Secondary | ICD-10-CM

## 2019-03-01 MED ORDER — PANTOPRAZOLE SODIUM 40 MG PO TBEC: 40.0000 mg | DELAYED_RELEASE_TABLET | Freq: Every day | ORAL | 11 refills | Status: AC

## 2019-03-01 NOTE — Patient Instructions (Signed)
    You are scheduled on 04/10/2019  at [facility] should arrive 15 minutes prior to your appointment time for registration. Please follow the written instructions below on the day of your exam:  [facility]   WARNING: IF YOU ARE ALLERGIC TO IODINE/X-RAY DYE, PLEASE NOTIFY RADIOLOGY IMMEDIATELY AT [phone]! YOU WILL BE GIVEN A 13 HOUR PREMEDICATION PREP.  1) Do not eat or drink anything after 6am (4 hours prior to your test) 2) You have been given 2 bottles of oral contrast to drink. The solution may taste better if refrigerated, but do NOT add ice or any other liquid to this solution. Shake well before drinking.    Drink 1 bottle of contrast @ 8am (2 hours prior to your exam)  Drink 1 bottle of contrast @ 9am (1 hour prior to your exam)  You may take any medications as prescribed with a small amount of water, if necessary. If you take any of the following medications: METFORMIN, GLUCOPHAGE, GLUCOVANCE, AVANDAMET, RIOMET, FORTAMET, [place] MET, JANUMET, GLUMETZA or METAGLIP, you MAY be asked to HOLD this medication 48 hours AFTER the exam.  The purpose of you drinking the oral contrast is to aid in the visualization of your intestinal tract. The contrast solution may cause some diarrhea. Depending on your individual set of symptoms, you may also receive an intravenous injection of x-ray contrast/dye. Plan on being at [facility] for 30 minutes or longer, depending on the type of exam you are having performed.  This test typically takes 30-45 minutes to complete.  If you have any questions regarding your exam or if you need to reschedule, you may call the CT department at [phone] between the hours of 8:00 am and 5:00 pm, Monday-Friday.  ________________________________________________________________________   CBC, CMP, lipase. (Come to [facility] 2nd floor to have labs drawn)    Switch omeprazole to Protonix 67m po qd #30, 11 refiils ( New  prescription sent to your pharmacy)   Increasae water intake.   Colace 1 tab po qd.   Can continue senna 1/day for now   FU tele-visit in 4 weeks. If still with problems, would proceed with EGD/colon.   Minimize use of marijuana/pain medications   [facility]  Dr GLyndel Safe

## 2019-03-01 NOTE — Progress Notes (Signed)
Chief Complaint: Abdominal pain  Referring Provider:  Lonie Peak, PA-C      ASSESSMENT AND PLAN;   #1. Lower abdo pain. Neg [place] 02/2017 #2. GERD. #3. N/V with H/O marijuana use/abuse. #4. IBS with constipation.  Plan: - CT abdo/pelvis with PO/IV contrast. - CBC, CMP, lipase. - Switch omeprazole to Protonix 40mg  po qd #30, 11 refiils. - Increase water intake. - Colace 1 tab po qd. - Can continue senna 1/day for now. - FU tele-visit in 4 weeks. If still with problems, would proceed with EGD/colon.  - Minimize use of marijuana/pain medications.   HPI:    Jenna Trevino is a 38 y.o. female  With longstanding history of lower abdominal pain, worst over the last 3 to 4 years Has associated constipation with pellet-like stools, abdominal bloating, pain does get better with defecation at times.  No melena or hematochezia.  Rare diarrhea. Initially did lose weight but has gained it back.  Now weighs more than before.  Longstanding intermittent nausea/vomiting with heartburn.  Rare dysphagia.  Had questionable history of EGD at the age 68 ("had stricture dilated").  She has been taking omeprazole intermittently.  Has been under considerable stress.  Being followed by [facility] mental health services in [place].  Lost grandfather January 2019.  Then lost brother at age 74 due to brain aneurysm and then father October 2019 due to alcohol abuse/alcohol related dementia.  Was not close to her father.  From care everywhere: Multiple visits to EDs [facility]:  CT 07/2016 - neg excerpt for constipation. Had mild thickening of appendix (underwent appendicectomy thereafter) [place] 02/2017- neg except for left renal scarring. Smokes marijuana H/O drug overdose- ED visit 10/27/2018- left AMA (had benzodiazepines, amphetamine, marijuana in the urine tox screen).  Hairstylist  Additional history on review of previous records: -Chronic back pain, migraine, opioid dependence, PTSD, chronic neck  pain Past Medical History:  Diagnosis Date  . Anemia    not on any meds  . Anxiety    doesn't take any meds  . Arthritis    RA in back, Knees, ankles and wrist bilateral  . Asthma    inhaler  . Bipolar disorder (HCC)   . Carpal tunnel syndrome    bilateral  . Chronic back pain    spondylolisthesis.Scoliosis  . Depression    doesn't take any meds  . Diverticulosis   . GERD (gastroesophageal reflux disease)    happens occasionally and will take OTC meds  . History of migraine    last one on 12/07/15  . Insomnia    doesn't take any meds  . Joint pain   . Obsessive compulsive disorder   . Panic attacks    doesn't take any meds    Past Surgical History:  Procedure Laterality Date  . ANTERIOR CERVICAL DECOMP/DISCECTOMY FUSION N/A 08/12/2018   Procedure: Anterior Cervical Decompression/ Discectomy Fusion - Cervical Five-Cervical Six - Cervical Six-Cervical Seven;  Surgeon: Julio Sicks, MD;  Location[facility] OR;  Service: Neurosurgery;  Laterality: N/A;  Anterior Cervical Decompression/ Discectomy Fusion - Cervical Five-Cervical Six - Cervical Six-Cervical Seven  . ANTERIOR CRUCIATE LIGAMENT REPAIR Left   . BACK SURGERY    . COLONOSCOPY    . DILATION AND CURETTAGE OF UTERUS     2007  . DILATION AND EVACUATION  2007  . TOOTH EXTRACTION    . TOTAL KNEE ARTHROPLASTY Left     Family History  Problem Relation Age of Onset  . ADD / ADHD  Son   . Heart disease Mother   . Cancer Maternal Aunt   . Diabetes Maternal Aunt   . Cancer Maternal Uncle     Social History   Tobacco Use  . Smoking status: Never Smoker  . Smokeless tobacco: Never Used  Substance Use Topics  . Alcohol use: No  . Drug use: Yes    Types: Marijuana    Comment: 3-4 days a week    Current Outpatient Medications  Medication Sig Dispense Refill  . acetaminophen (TYLENOL) 500 MG tablet Take 500 mg by mouth every 6 (six) hours as needed for moderate pain.    Marland Kitchen albuterol (PROVENTIL HFA;VENTOLIN HFA) 108 (90  BASE) MCG/ACT inhaler Inhale 2 puffs into the lungs every 6 (six) hours as needed for wheezing or shortness of breath.    . Biotin w/ Vitamins C & E (HAIR/SKIN/NAILS PO) Take 1 tablet by mouth daily.    . diazepam (VALIUM) 5 MG tablet Take 1-2 tablets (5-10 mg total) by mouth every 6 (six) hours as needed for muscle spasms. 60 tablet 0  . ibuprofen (ADVIL,MOTRIN) 600 MG tablet Take 1 tablet (600 mg total) by mouth every 6 (six) hours. 40 tablet 0  . levonorgestrel (MIRENA) 20 MCG/24HR IUD 1 each by Intrauterine route continuous.    . montelukast (SINGULAIR) 10 MG tablet Take 10 mg by mouth at bedtime.    . mupirocin nasal ointment (BACTROBAN) 2 % Place 1 application into the nose daily as needed (dry nose (interior)). Use one-half of tube in each nostril twice daily for five (5) days. After application, press sides of nose together and gently massage.    Marland Kitchen omeprazole (PRILOSEC) 20 MG capsule Take 20 mg by mouth daily as needed (heartburn).    . QUEtiapine (SEROQUEL) 200 MG tablet Take 200 mg by mouth at bedtime.  0  . vitamin C (ASCORBIC ACID) 250 MG tablet Take 500 mg by mouth daily.     No current facility-administered medications for this visit.     Allergies  Allergen Reactions  . Bee Venom Anaphylaxis  . Tobacco [Nicotiana Tabacum] Anaphylaxis  . Percocet [Oxycodone-Acetaminophen] Itching and Rash    Review of Systems:  Constitutional: Denies fever, chills, diaphoresis, appetite change and has fatigue.  HEENT: Chronic neck pain Respiratory: Denies SOB, DOE, cough, chest tightness,  and wheezing.   Cardiovascular: Denies chest pain, palpitations and leg swelling.  Genitourinary: Denies dysuria, urgency, frequency, hematuria, flank pain and difficulty urinating.  Musculoskeletal: Has  myalgias, back pain, joint swelling, arthralgias and gait problem.  Skin: No rash.  Neurological: Denies dizziness, seizures, syncope, weakness, light-headedness, numbness and headaches.  Hematological:  Denies adenopathy. Easy bruising, personal or family bleeding history  Psychiatric/Behavioral: has anxiety or depression     Physical Exam:    Ht 6' (1.829 m)   Wt 173 lb (78.5 kg)   BMI 23.46 kg/m  Filed Weights   03/01/19 0942  Weight: 173 lb (78.5 kg)  Meant since it was a tele-visit.   Data Reviewed: I have personally reviewed following labs and imaging studies  CBC: CBC Latest Ref Rng & Units 08/11/2018 12/25/2015 12/09/2015  WBC 4.0 - 10.5 K/uL 14.2(H) 9.7 7.2  Hemoglobin 12.0 - 15.0 g/dL 90.2 11.5(L) 14.7  Hematocrit 36.0 - 46.0 % 43.9 34.8(L) 42.3  Platelets 150 - 400 K/uL 279 439(H) 217    CMP: CMP Latest Ref Rng & Units 08/11/2018 12/25/2015 12/09/2015  Glucose 70 - 99 mg/dL 71 409(B) 86  BUN 6 -  20 mg/dL 11 11 15   Creatinine 0.44 - 1.00 mg/dL 0.450.74 4.090.81 8.110.81  Sodium 135 - 145 mmol/L 140 139 141  Potassium 3.5 - 5.1 mmol/L 3.5 4.2 4.2  Chloride 98 - 111 mmol/L 107 105 105  CO2 22 - 32 mmol/L 24 22 24   Calcium 8.9 - 10.3 mg/dL 9.0 9.4 9.7  Total Protein 6.5 - 8.1 g/dL 7.4 7.5 -  Total Bilirubin 0.3 - 1.2 mg/dL 0.4 0.5 -  Alkaline Phos 38 - 126 U/L 61 76 -  AST 15 - 41 U/L 16 20 -  ALT 0 - 44 U/L 13 26 -  This service was provided via telemedicine.  The patient was located at home.  The provider was located in office.  The patient did consent to this telephone visit and is aware of possible charges through their insurance for this visit.  The patient was referred by Lonie PeakNathan Conroy PA.    Time spent on call and coordination of care: 45 min     Edman Circleaj Kalyssa Anker, MD 03/01/2019, 11:16 AM  Cc: Lonie Peakonroy, Nathan, PA-C

## 2019-03-01 NOTE — Telephone Encounter (Signed)
Informed patient that she has been scheduled for her CT scan at [facility] on 04/10/2019 at 10am and to come this week to pick up contrast and come to the second floor to get her labs drawn  Will mail CT instructions to the patient

## 2019-03-22 ENCOUNTER — Telehealth: Payer: Self-pay | Admitting: *Deleted

## 2019-03-22 NOTE — Telephone Encounter (Signed)
REFERRAL SENT TO SCHEDULING AND NOTES ON FILE FROM [facility] LYNN LAM, NP [phone]

## 2019-03-27 ENCOUNTER — Ambulatory Visit: Payer: Medicaid Other | Admitting: Cardiology

## 2019-03-28 ENCOUNTER — Ambulatory Visit: Payer: Medicaid Other | Admitting: Cardiology

## 2019-03-29 ENCOUNTER — Telehealth: Payer: Self-pay

## 2019-03-29 ENCOUNTER — Telehealth: Payer: Self-pay | Admitting: Gastroenterology

## 2019-03-29 NOTE — Telephone Encounter (Signed)
Pt's insurance Medicaid has denied case for CT abd/pelvis with contrast for the following: Denied Based on [employer] Abdomen Imaging Guidelines Section: AB 2.6 Chronic Abdominal Pain, we cannot approve this request. Your records show that your doctor is evaluating you for pain in your abdomen. The reason this request cannot be approved is because: 1. Guidelines support an initial EGD (upper endoscopy) when pain is localized in the upper abdomen particularly if other upper GI symptoms are present (including early satiety or nausea) prior to other imaging studies. The clinical information provided does not describe results of an EGD 2. Guidelines support an initial colonoscopy for pain in the lower abdomen and/or is associated with changes in bowel habits prior to other imaging studies. The clinical information provided does not describe results of a colonoscopy and, therefore, the request is not indicated at this time.  Pt's insurance is requiring patient have and EGD/Colon before a CT. Please review and advise as to how to proceed.  Thank you, Erie Noe

## 2019-03-30 NOTE — Telephone Encounter (Signed)
LMOM for patient to call back and schedule EGD/ Colonoscopy with Miralax prep.

## 2019-03-30 NOTE — Telephone Encounter (Signed)
Wow, nice.  -Lets proceed with EGD and colonoscopy.  Use MiraLAX preparation.  If negative, then would proceed with CT.

## 2019-04-04 NOTE — Telephone Encounter (Signed)
LMOM for patient to call back and schedule EGD/Colon.

## 2019-04-06 NOTE — Telephone Encounter (Signed)
LMOM for patient to call back.

## 2019-04-10 ENCOUNTER — Telehealth: Payer: Self-pay | Admitting: Cardiology

## 2019-04-10 ENCOUNTER — Ambulatory Visit (HOSPITAL_BASED_OUTPATIENT_CLINIC_OR_DEPARTMENT_OTHER): Payer: Medicaid Other

## 2019-04-10 DIAGNOSIS — N83209 Unspecified ovarian cyst, unspecified side: Secondary | ICD-10-CM | POA: Insufficient documentation

## 2019-04-10 DIAGNOSIS — M545 Low back pain, unspecified: Secondary | ICD-10-CM | POA: Insufficient documentation

## 2019-04-10 NOTE — Telephone Encounter (Signed)
Virtual Visit Pre-Appointment Phone Call  "(Name), I am calling you today to discuss your upcoming appointment. We are currently trying to limit exposure to the virus that causes COVID-19 by seeing patients at home rather than in the office."  1. "What is the BEST phone number to call the day of the visit?" - include this in appointment notes  2. Do you have or have access to (through a family member/friend) a smartphone with video capability that we can use for your visit?" a. If yes - list this number in appt notes as cell (if different from BEST phone #) and list the appointment type as a VIDEO visit in appointment notes b. If no - list the appointment type as a PHONE visit in appointment notes  3. Confirm consent - "In the setting of the current Covid19 crisis, you are scheduled for a (phone or video) visit with your provider on (date) at (time).  Just as we do with many in-office visits, in order for you to participate in this visit, we must obtain consent.  If you'd like, I can send this to your mychart (if signed up) or email for you to review.  Otherwise, I can obtain your verbal consent now.  All virtual visits are billed to your insurance company just like a normal visit would be.  By agreeing to a virtual visit, we'd like you to understand that the technology does not allow for your provider to perform an examination, and thus may limit your provider's ability to fully assess your condition. If your provider identifies any concerns that need to be evaluated in person, we will make arrangements to do so.  Finally, though the technology is pretty good, we cannot assure that it will always work on either your or our end, and in the setting of a video visit, we may have to convert it to a phone-only visit.  In either situation, we cannot ensure that we have a secure connection.  Are you willing to proceed?" STAFF: Did the patient verbally acknowledge consent to telehealth visit? Document  YES/NO here: YES  4. Advise patient to be prepared - "Two hours prior to your appointment, go ahead and check your blood pressure, pulse, oxygen saturation, and your weight (if you have the equipment to check those) and write them all down. When your visit starts, your provider will ask you for this information. If you have an Apple Watch or Kardia device, please plan to have heart rate information ready on the day of your appointment. Please have a pen and paper handy nearby the day of the visit as well."  5. Give patient instructions for MyChart download to smartphone OR Doximity/Doxy.me as below if video visit (depending on what platform provider is using)  6. Inform patient they will receive a phone call 15 minutes prior to their appointment time (may be from unknown caller ID) so they should be prepared to answer    TELEPHONE CALL NOTE  Jenna Trevino has been deemed a candidate for a follow-up tele-health visit to limit community exposure during the Covid-19 pandemic. I spoke with the patient via phone to ensure availability of phone/video source, confirm preferred email & phone number, and discuss instructions and expectations.  I reminded Jenna Trevino to be prepared with any vital sign and/or heart rhythm information that could potentially be obtained via home monitoring, at the time of her visit. I reminded Jenna Trevino to expect a phone call prior to  her visit.  Minette Brine 04/10/2019 8:22 AM   INSTRUCTIONS FOR DOWNLOADING THE MYCHART APP TO SMARTPHONE  - The patient must first make sure to have activated MyChart and know their login information - If Apple, go to [employer] and type in MyChart in the search bar and download the app. If Android, ask patient to go to [employer] and type in [place] in the search bar and download the app. The app is free but as with any other app downloads, their phone may require them to verify saved payment information or Apple/Android  password.  - The patient will need to then log into the app with their MyChart username and password, and select [facility] as their healthcare provider to link the account. When it is time for your visit, go to the MyChart app, find appointments, and click Begin Video Visit. Be sure to Select Allow for your device to access the Microphone and Camera for your visit. You will then be connected, and your provider will be with you shortly.  **If they have any issues connecting, or need assistance please contact MyChart service desk [phone])**  **If using a computer, in order to ensure the best quality for their visit they will need to use either of the following Internet Browsers: [employer] or Google Chrome**  IF USING DOXIMITY or DOXY.ME - The patient will receive a link just prior to their visit by text.     FULL LENGTH CONSENT FOR TELE-HEALTH VISIT   I hereby voluntarily request, consent and authorize [facility] and its employed or contracted physicians, physician assistants, nurse practitioners or other licensed health care professionals (the Practitioner), to provide me with telemedicine health care services (the Services") as deemed necessary by the treating Practitioner. I acknowledge and consent to receive the Services by the Practitioner via telemedicine. I understand that the telemedicine visit will involve communicating with the Practitioner through live audiovisual communication technology and the disclosure of certain medical information by electronic transmission. I acknowledge that I have been given the opportunity to request an in-person assessment or other available alternative prior to the telemedicine visit and am voluntarily participating in the telemedicine visit.  I understand that I have the right to withhold or withdraw my consent to the use of telemedicine in the course of my care at any time, without affecting my right to future care or treatment,  and that the Practitioner or I may terminate the telemedicine visit at any time. I understand that I have the right to inspect all information obtained and/or recorded in the course of the telemedicine visit and may receive copies of available information for a reasonable fee.  I understand that some of the potential risks of receiving the Services via telemedicine include:   Delay or interruption in medical evaluation due to technological equipment failure or disruption;  Information transmitted may not be sufficient (e.g. poor resolution of images) to allow for appropriate medical decision making by the Practitioner; and/or   In rare instances, security protocols could fail, causing a breach of personal health information.  Furthermore, I acknowledge that it is my responsibility to provide information about my medical history, conditions and care that is complete and accurate to the best of my ability. I acknowledge that Practitioner's advice, recommendations, and/or decision may be based on factors not within their control, such as incomplete or inaccurate data provided by me or distortions of diagnostic images or specimens that may result from electronic transmissions. I  understand that the practice of medicine is not an exact science and that Practitioner makes no warranties or guarantees regarding treatment outcomes. I acknowledge that I will receive a copy of this consent concurrently upon execution via email to the email address I last provided but may also request a printed copy by calling the office of [place].    I understand that my insurance will be billed for this visit.   I have read or had this consent read to me.  I understand the contents of this consent, which adequately explains the benefits and risks of the Services being provided via telemedicine.   I have been provided ample opportunity to ask questions regarding this consent and the Services and have had my questions  answered to my satisfaction.  I give my informed consent for the services to be provided through the use of telemedicine in my medical care  By participating in this telemedicine visit I agree to the above.

## 2019-04-11 ENCOUNTER — Other Ambulatory Visit: Payer: Self-pay

## 2019-04-11 ENCOUNTER — Telehealth (INDEPENDENT_AMBULATORY_CARE_PROVIDER_SITE_OTHER): Payer: Medicaid Other | Admitting: Cardiology

## 2019-04-11 ENCOUNTER — Encounter: Payer: Self-pay | Admitting: *Deleted

## 2019-04-11 ENCOUNTER — Other Ambulatory Visit: Payer: Self-pay | Admitting: *Deleted

## 2019-04-11 DIAGNOSIS — R0789 Other chest pain: Secondary | ICD-10-CM

## 2019-04-11 DIAGNOSIS — R079 Chest pain, unspecified: Secondary | ICD-10-CM

## 2019-04-11 NOTE — Patient Instructions (Signed)
Medication Instructions:  Your physician recommends that you continue on your current medications as directed. Please refer to the Current Medication list given to you today.  If you need a refill on your cardiac medications before your next appointment, please call your pharmacy.   Lab work: NONE If you have labs (blood work) drawn today and your tests are completely normal, you will receive your results only by: Marland Kitchen MyChart Message (if you have MyChart) OR . A paper copy in the mail If you have any lab test that is abnormal or we need to change your treatment, we will call you to review the results.  Testing/Procedures: Your physician has requested that you have a lexiscan myoview. For further information please visit [email or web address] Please follow instruction sheet, as given.     [facility] [address] [place], [place] [address] Phone:  [phone]  April 11, 2019    Jenna Trevino DOB: 1981/07/01 MRN: [id number] [address] [place] [address]   Dear Jenna Trevino,  You are scheduled for a Myocardial Perfusion Imaging Study on:  June 23,2020  at 11 am.  Please arrive 15 minutes prior to your appointment time for registration and insurance purposes.  The test will take approximately 3 to 4 hours to complete; you may bring reading material.  If someone comes with you to your appointment, they will need to remain in the main lobby due to limited space in the testing area. **If you are pregnant or breastfeeding, please notify the nuclear lab prior to your appointment**  How to prepare for your Myocardial Perfusion Test: . Do not eat or drink 3 hours prior to your test, except you may have water. . Do not consume products containing caffeine (regular or decaffeinated) 12 hours prior to your test. (ex: coffee, chocolate, sodas, tea). . Do bring a list of your current medications with you.  If not listed below, you may take your medications as normal. .  Do wear comfortable clothes (no dresses or overalls) and walking shoes, tennis shoes preferred (No heels or open toe shoes are allowed). . Do NOT wear cologne, perfume, aftershave, or lotions (deodorant is allowed). . If these instructions are not followed, your test will have to be rescheduled.  Please report to [address] for your test.  If you have questions or concerns about your appointment, you can call the [facility] at [phone].  If you cannot keep your appointment, please provide 24 hours notification to the Nuclear Lab, to avoid a possible $50 charge to your account.    Follow-Up: At [facility], you and your health needs are our priority.  As part of our continuing mission to provide you with exceptional heart care, we have created designated Provider Care Teams.  These Care Teams include your primary Cardiologist (physician) and Advanced Practice Providers (APPs -  Physician Assistants and Nurse Practitioners) who all work together to provide you with the care you need, when you need it. You will need a follow up appointment in 3 months.    Any Other Special Instructions Will Be Listed Below  Regadenoson injection What is this medicine? REGADENOSON is used to test the heart for coronary artery disease. It is used in patients who can not exercise for their stress test. This medicine may be used for other purposes; ask your health care provider or pharmacist if you have questions. COMMON BRAND NAME(S): Lexiscan What should I tell my  health care provider before I take this medicine? They need to know if you have any of these conditions: -heart problems -lung or breathing disease, like asthma or COPD -an unusual or allergic reaction to regadenoson, other medicines, foods, dyes, or preservatives -pregnant or trying to get pregnant -breast-feeding How should I use this medicine? This medicine is for injection into a vein. It is  given by a health care professional in a hospital or clinic setting. Talk to your pediatrician regarding the use of this medicine in children. Special care may be needed. Overdosage: If you think you have taken too much of this medicine contact a poison control center or emergency room at once. NOTE: This medicine is only for you. Do not share this medicine with others. What if I miss a dose? This does not apply. What may interact with this medicine? -caffeine -dipyridamole -guarana -theophylline This list may not describe all possible interactions. Give your health care provider a list of all the medicines, herbs, non-prescription drugs, or dietary supplements you use. Also tell them if you smoke, drink alcohol, or use illegal drugs. Some items may interact with your medicine. What should I watch for while using this medicine? Your condition will be monitored carefully while you are receiving this medicine. Do not take medicines, foods, or drinks with caffeine (like coffee, tea, or colas) for at least 12 hours before your test. If you do not know if something contains caffeine, ask your health care professional. What side effects may I notice from receiving this medicine? Side effects that you should report to your doctor or health care professional as soon as possible: -allergic reactions like skin rash, itching or hives, swelling of the face, lips, or tongue -breathing problems -chest pain, tightness or palpitations -severe headache Side effects that usually do not require medical attention (report to your doctor or health care professional if they continue or are bothersome): -flushing -headache -irritation or pain at site where injected -nausea, vomiting This list may not describe all possible side effects. Call your doctor for medical advice about side effects. You may report side effects to FDA at 1-800-FDA-1088. Where should I keep my medicine? This drug is given in a hospital or  clinic and will not be stored at home. NOTE: This sheet is a summary. It may not cover all possible information. If you have questions about this medicine, talk to your doctor, pharmacist, or health care provider.  2019 Elsevier/Gold Standard (2008-06-25 15:08:13)    Cardiac Nuclear Scan A cardiac nuclear scan is a test that is done to check the flow of blood to your heart. It is done when you are resting and when you are exercising. The test looks for problems such as:  Not enough blood reaching a portion of the heart.  The heart muscle not working as it should. You may need this test if:  You have heart disease.  You have had lab results that are not normal.  You have had heart surgery or a balloon procedure to open up blocked arteries (angioplasty).  You have chest pain.  You have shortness of breath. In this test, a special dye (tracer) is put into your bloodstream. The tracer will travel to your heart. A camera will then take pictures of your heart to see how the tracer moves through your heart. This test is usually done at a hospital and takes 2-4 hours. Tell a doctor about:  Any allergies you have.  All medicines you are taking, including  vitamins, herbs, eye drops, creams, and over-the-counter medicines.  Any problems you or family members have had with anesthetic medicines.  Any blood disorders you have.  Any surgeries you have had.  Any medical conditions you have.  Whether you are pregnant or may be pregnant. What are the risks? Generally, this is a safe test. However, problems may occur, such as:  Serious chest pain and heart attack. This is only a risk if the stress portion of the test is done.  Rapid heartbeat.  A feeling of warmth in your chest. This feeling usually does not last long.  Allergic reaction to the tracer. What happens before the test?  Ask your doctor about changing or stopping your normal medicines. This is important.  Follow  instructions from your doctor about what you cannot eat or drink.  Remove your jewelry on the day of the test. What happens during the test?  An IV tube will be inserted into one of your veins.  Your doctor will give you a small amount of tracer through the IV tube.  You will wait for 20-40 minutes while the tracer moves through your bloodstream.  Your heart will be monitored with an electrocardiogram (ECG).  You will lie down on an exam table.  Pictures of your heart will be taken for about 15-20 minutes.  You may also have a stress test. For this test, one of these things may be done: ? You will be asked to exercise on a treadmill or a stationary bike. ? You will be given medicines that will make your heart work harder. This is done if you are unable to exercise.  When blood flow to your heart has peaked, a tracer will again be given through the IV tube.  After 20-40 minutes, you will get back on the exam table. More pictures will be taken of your heart.  Depending on the tracer that is used, more pictures may need to be taken 3-4 hours later.  Your IV tube will be removed when the test is over. The test may vary among doctors and hospitals. What happens after the test?  Ask your doctor: ? Whether you can return to your normal schedule, including diet, activities, and medicines. ? Whether you should drink more fluids. This will help to remove the tracer from your body. Drink enough fluid to keep your pee (urine) pale yellow.  Ask your doctor, or the department that is doing the test: ? When will my results be ready? ? How will I get my results? Summary  A cardiac nuclear scan is a test that is done to check the flow of blood to your heart.  Tell your doctor whether you are pregnant or may be pregnant.  Before the test, ask your doctor about changing or stopping your normal medicines. This is important.  Ask your doctor whether you can return to your normal activities.  You may be asked to drink more fluids. This information is not intended to replace advice given to you by your health care provider. Make sure you discuss any questions you have with your health care provider. Document Released: 04/11/2018 Document Revised: 04/11/2018 Document Reviewed: 04/11/2018 Elsevier Interactive Patient Education  2019 [employer]

## 2019-04-11 NOTE — Progress Notes (Signed)
Virtual Visit via Video Note   This visit type was conducted due to national recommendations for restrictions regarding the COVID-19 Pandemic (e.g. social distancing) in an effort to limit this patient's exposure and mitigate transmission in our community.  Due to her co-morbid illnesses, this patient is at least at moderate risk for complications without adequate follow up.  This format is felt to be most appropriate for this patient at this time.  All issues noted in this document were discussed and addressed.  A limited physical exam was performed with this format.  Please refer to the patient's chart for her consent to telehealth for [facility]   Date:  04/11/2019   ID:  Jenna Trevino, DOB 11/25/1980, MRN [id number]  Patient Location: Home Provider Location: Home  PCP:  Lonie Peak, PA-C  Cardiologist:  No primary care provider on file.  Electrophysiologist:  None   Evaluation Performed:  Consultation - Jenna Trevino was referred by Dr Orvan Falconer for the evaluation of chest discomfort.  Chief Complaint: Chest discomfort  History of Present Illness:    Jenna Trevino is a 38 y.o. female with no significant past medical history.  She denies any history of essential hypertension dyslipidemia or diabetes mellitus.  She tells me that her brother passed away with a stroke and he about her age.  Patient mentions to me that she has chest discomfort she can feel it when no orthopnea or PND.  No radiation to any part of the body.  At the time of my evaluation, the patient is alert awake oriented and in no distress.  The patient mentions to me that she went for a hike about 2-1/2 miles a couple of days ago with her children and with that she did not experience any symptoms.  She is a non-smoker.  The patient does not have symptoms concerning for COVID-19 infection (fever, chills, cough, or new shortness of breath).    Past Medical History:  Diagnosis Date  . Anemia    not on any meds   . Anxiety    doesn't take any meds  . Arthritis    RA in back, Knees, ankles and wrist bilateral  . Asthma    inhaler  . Bipolar disorder (HCC)   . Carpal tunnel syndrome    bilateral  . Chronic back pain    spondylolisthesis.Scoliosis  . Common migraine   . Depression    doesn't take any meds  . Diverticulosis   . GERD (gastroesophageal reflux disease)    happens occasionally and will take OTC meds  . Insomnia    doesn't take any meds  . Joint pain   . Obsessive compulsive disorder   . Opioid dependence (HCC)   . 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 Palpitation   . Panic attacks    doesn't take any meds  . PTSD (post-traumatic stress disorder)   . TMJ (temporomandibular joint disorder)    Past Surgical History:  Procedure Laterality Date  . ANTERIOR CERVICAL DECOMP/DISCECTOMY FUSION N/A 08/12/2018   Procedure: Anterior Cervical Decompression/ Discectomy Fusion - Cervical Five-Cervical Six - Cervical Six-Cervical Seven;  Surgeon: Julio Sicks, MD;  [facility];  Service: Neurosurgery;  Laterality: N/A;  Anterior Cervical Decompression/ Discectomy Fusion - Cervical Five-Cervical Six - Cervical Six-Cervical Seven  . ANTERIOR CRUCIATE LIGAMENT REPAIR Left   . BACK SURGERY    . COLONOSCOPY    . DILATION AND CURETTAGE OF UTERUS  2007  . DILATION AND EVACUATION  2007  . TOOTH EXTRACTION    . TOTAL KNEE ARTHROPLASTY Left      Current Meds  Medication Sig  . DULERA 100-5 MCG/ACT AERO Inhale 2 puffs into the lungs 2 (two) times daily.  . pantoprazole (PROTONIX) 40 MG tablet Take 1 tablet (40 mg total) by mouth daily.  . QUEtiapine (SEROQUEL) 100 MG tablet TAKE 1 TABLET BY MOUTH IN THE MORNING AND 2 AT BEDTIME  . SUBOXONE 8-2 MG FILM 1 (ONE) FILM DAILY  . [DISCONTINUED] Biotin w/ Vitamins C & E (HAIR/SKIN/NAILS PO) Take 1 tablet by mouth daily.     Allergies:   Bee venom; Tobacco [nicotiana tabacum]; Acetaminophen; and Percocet  [oxycodone-acetaminophen]   Social History   Tobacco Use  . Smoking status: Never Smoker  . Smokeless tobacco: Never Used  Substance Use Topics  . Alcohol use: Yes    Comment: Occasional  . Drug use: Not Currently    Comment: 3-4 days a week     Family Hx: The patient's family history includes ADD / ADHD in her son; CAD in her mother; Cancer in her maternal aunt and maternal uncle; Diabetes in her maternal aunt; Heart disease in her mother; Hypercholesterolemia in her father; Stroke in her mother.  ROS:   Please see the history of present illness.    As mentioned above All other systems reviewed and are negative.   Prior CV studies:   The following studies were reviewed today:  None  Labs/Other Tests and Data Reviewed:    EKG:  No ECG reviewed.  Recent Labs: 08/11/2018: ALT 13; BUN 11; Creatinine, Ser 0.74; Hemoglobin 14.3; Platelets 279; Potassium 3.5; Sodium 140   Recent Lipid Panel No results found for: CHOL, TRIG, HDL, CHOLHDL, LDLCALC, LDLDIRECT  Wt Readings from Last 3 Encounters:  04/11/19 167 lb (75.8 kg)  03/01/19 173 lb (78.5 kg)  08/11/18 173 lb 8 oz (78.7 kg)     Objective:    Vital Signs:  BP 128/74 (BP Location: Left Arm, Patient Position: Sitting, Cuff Size: Normal)   Pulse 100   Ht 6\' 1"  (1.854 m)   Wt 167 lb (75.8 kg)   BMI 22.03 kg/m    VITAL SIGNS:  reviewed  ASSESSMENT & PLAN:    1. Chest discomfort: The patient's symptoms are atypical for coronary etiology.  She gives history of sudden death in her brother.  She tells me that it was a stroke.  She is very worried about her symptoms.  In view of this we will set her up for a Lexiscan sestamibi.  I asked her if there was any chance of she being pregnant.  She answered in the negative.  She mentions to me that she has an intrauterine device and does not even want to be tested for pregnancy and we respect her wishes.  Further recommendations will be made based on the findings of the Lexiscan  sestamibi.  I reassured the patient about these findings.  Her other issues such as lipid and cholesterol screening will be left up to her primary care physician.  She knows to go to the nearest emergency room for any concerning symptoms.  She will be seen in follow-up appointment in 3 months or earlier if she has any concerns.  Patient had multiple questions which were answered to her satisfaction.  COVID-19 Education: The signs and symptoms of COVID-19 were discussed with the patient and how to seek care for testing (follow up with  PCP or arrange E-visit).  The importance of social distancing was discussed today.  Time:   Today, I have spent 30 minutes with the patient with telehealth technology discussing the above problems.  I reviewed her records and total time for above including of record review and management was 45 minutes.   Medication Adjustments/Labs and Tests Ordered: Current medicines are reviewed at length with the patient today.  Concerns regarding medicines are outlined above.   Tests Ordered: No orders of the defined types were placed in this encounter.   Medication Changes: No orders of the defined types were placed in this encounter.   Disposition:  Follow up in 3 month(s)  Signed, Garwin Brothersajan R Friend Dorfman, MD  04/11/2019 1:56 PM    [facility]

## 2019-04-11 NOTE — Addendum Note (Signed)
Addended by: Pamala Hurry on: 04/11/2019 03:33 PM   Modules accepted: Orders

## 2019-04-14 NOTE — Telephone Encounter (Signed)
LMOM for patient to call me back and schedule procedure. 

## 2019-04-17 ENCOUNTER — Telehealth: Payer: Self-pay | Admitting: Cardiology

## 2019-04-17 NOTE — Telephone Encounter (Signed)
LMOM to call back and schedule.

## 2019-04-17 NOTE — Telephone Encounter (Signed)
RH form for lexi generated and given to A.Troutman pending authorization by L.Judeth Horn.

## 2019-04-17 NOTE — Telephone Encounter (Signed)
I called patient and tried to rescheduleNuc due to no techs in the office and she started crying and wants to have the test done ASAP! Patient wants to have at [facility], I will send for precert if you can please write out order.Marland Kitchen

## 2019-04-26 NOTE — Telephone Encounter (Signed)
Created in error

## 2019-07-18 ENCOUNTER — Other Ambulatory Visit: Payer: Self-pay

## 2019-07-18 ENCOUNTER — Telehealth (INDEPENDENT_AMBULATORY_CARE_PROVIDER_SITE_OTHER): Payer: Medicaid Other | Admitting: Cardiology

## 2019-07-18 ENCOUNTER — Encounter: Payer: Self-pay | Admitting: Cardiology

## 2019-07-18 VITALS — BP 138/94 | HR 94 | Ht 71.0 in | Wt 167.3 lb

## 2019-07-18 DIAGNOSIS — Z01818 Encounter for other preprocedural examination: Secondary | ICD-10-CM

## 2019-07-18 DIAGNOSIS — R0789 Other chest pain: Secondary | ICD-10-CM | POA: Diagnosis not present

## 2019-07-18 MED ORDER — NITROGLYCERIN 0.4 MG SL SUBL: 0.4000 mg | SUBLINGUAL_TABLET | SUBLINGUAL | 3 refills | Status: AC | PRN

## 2019-07-18 NOTE — Patient Instructions (Signed)
Medication Instructions:  Your physician has recommended you make the following change in your medication:  START taking nitroglycerin as needed for chest pain When having chest pain, stop what you are doing and sit down. Take 1 nitro, wait 5 minutes. Still having chest pain, take 1 nitro, wait 5 minutes. Still having chest pain, take 1 nitro, dial 911. Total of 3 nitro in 15 minutes.  If you need a refill on your cardiac medications before your next appointment, please call your pharmacy.   Lab work: You will need to pickup urinalysis kit to be collected prior to [place].  If you have labs (blood work) drawn today and your tests are completely normal, you will receive your results only by: Marland Kitchen MyChart Message (if you have MyChart) OR . A paper copy in the mail If you have any lab test that is abnormal or we need to change your treatment, we will call you to review the results.  Testing/Procedures: NONE  Follow-Up: At [facility], you and your health needs are our priority.  As part of our continuing mission to provide you with exceptional heart care, we have created designated Provider Care Teams.  These Care Teams include your primary Cardiologist (physician) and Advanced Practice Providers (APPs -  Physician Assistants and Nurse Practitioners) who all work together to provide you with the care you need, when you need it. You will need a follow up appointment in 6 months.

## 2019-07-18 NOTE — Progress Notes (Signed)
Virtual Visit via Video Note   This visit type was conducted due to national recommendations for restrictions regarding the COVID-19 Pandemic (e.g. social distancing) in an effort to limit this patient's exposure and mitigate transmission in our community.  Due to her co-morbid illnesses, this patient is at least at moderate risk for complications without adequate follow up.  This format is felt to be most appropriate for this patient at this time.  All issues noted in this document were discussed and addressed.  A limited physical exam was performed with this format.  Please refer to the patient's chart for her consent to telehealth for [facility].   Date:  07/18/2019   ID:  Jenna Trevino, DOB 1981/08/29, MRN [id number]  Patient Location: Home Provider Location: Office  PCP:  Cyndi Bender, PA-C  Cardiologist:  No primary care provider on file.  Electrophysiologist:  None   Evaluation Performed:  Follow-Up Visit  Chief Complaint: Chest discomfort  History of Present Illness:    Jenna Trevino is a 38 y.o. female with past medical history that is not much significant.  Patient was evaluated for chest discomfort but she did not have a stress test because of the pandemic.  She tells me that she is very active lady.  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  Exertion does not bring about the symptoms.  She also mentions to me that sexual activity does not bring about the symptoms.  When she is anxious and stressed up she has tightness in the chest.  She does not smoke.  She has no history of hypertension dyslipidemia or diabetes mellitus.  At the time of my evaluation, the patient is alert awake oriented and in no distress.  The patient does not have symptoms concerning for COVID-19 infection (fever, chills, cough, or new shortness of breath).    Past Medical History:  Diagnosis Date  . Anemia    not on any meds  .  Anxiety    doesn't take any meds  . Arthritis    RA in back, Knees, ankles and wrist bilateral  . Asthma    inhaler  . Bipolar disorder ([place])   . Carpal tunnel syndrome    bilateral  . Chronic back pain    spondylolisthesis.Scoliosis  . Common migraine   . Depression    doesn't take any meds  . Diverticulosis   . GERD (gastroesophageal reflux disease)    happens occasionally and will take OTC meds  . Insomnia    doesn't take any meds  . Joint pain   . Obsessive compulsive disorder   . Opioid dependence ([place])   . 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 Palpitation   . Panic attacks    doesn't take any meds  . PTSD (post-traumatic stress disorder)   . TMJ (temporomandibular joint disorder)    Past Surgical History:  Procedure Laterality Date  . ANTERIOR CERVICAL DECOMP/DISCECTOMY FUSION N/A 08/12/2018   Procedure: Anterior Cervical Decompression/ Discectomy Fusion - Cervical Five-Cervical Six - Cervical Six-Cervical Seven;  Surgeon: Earnie Larsson, MD;  Location: [facility];  Service: Neurosurgery;  Laterality: N/A;  Anterior Cervical Decompression/ Discectomy Fusion - Cervical Five-Cervical Six - Cervical Six-Cervical Seven  . ANTERIOR CRUCIATE LIGAMENT REPAIR Left   . BACK SURGERY    . COLONOSCOPY    . DILATION AND CURETTAGE OF UTERUS     2007  .  DILATION AND EVACUATION  2007  . TOOTH EXTRACTION    . TOTAL KNEE ARTHROPLASTY Left      Current Meds  Medication Sig  . BIOTIN PO Take 6,000 mcg by mouth 3 (three) times daily.  . Cholecalciferol (D3 DOTS) 50 MCG (2000 UT) TBDP Take by mouth.  . DULERA 100-5 MCG/ACT AERO Inhale 2 puffs into the lungs 2 (two) times daily.  . montelukast (SINGULAIR) 10 MG tablet TAKE 1 (ONE) TABLET BY MOUTH DAILY FOR ASTHMA  . omeprazole (PRILOSEC) 20 MG capsule Take 20 mg by mouth daily.  . QUEtiapine (SEROQUEL) 100 MG tablet TAKE 1 TABLET BY MOUTH IN THE MORNING AND 2 AT BEDTIME  . vitamin B-12 (CYANOCOBALAMIN) 500  MCG tablet Take 500 mcg by mouth daily.     Allergies:   Bee venom, Tobacco [nicotiana tabacum], Acetaminophen, and Percocet [oxycodone-acetaminophen]   Social History   Tobacco Use  . Smoking status: Never Smoker  . Smokeless tobacco: Never Used  Substance Use Topics  . Alcohol use: Yes    Comment: Occasional  . Drug use: Not Currently    Comment: 3-4 days a week     Family Hx: The patient's family history includes ADD / ADHD in her son; CAD in her mother; Cancer in her maternal aunt and maternal uncle; Diabetes in her maternal aunt; Heart disease in her mother; Hypercholesterolemia in her father; Stroke in her mother.  ROS:   Please see the history of present illness.    As mentioned above All other systems reviewed and are negative.   Prior CV studies:   The following studies were reviewed today:  None  Labs/Other Tests and Data Reviewed:    EKG:  EKG from previous visit was reviewed  Recent Labs: 08/11/2018: ALT 13; BUN 11; Creatinine, Ser 0.74; Hemoglobin 14.3; Platelets 279; Potassium 3.5; Sodium 140   Recent Lipid Panel No results found for: CHOL, TRIG, HDL, CHOLHDL, LDLCALC, LDLDIRECT  Wt Readings from Last 3 Encounters:  07/18/19 167 lb 4.8 oz (75.9 kg)  04/11/19 167 lb (75.8 kg)  03/01/19 173 lb (78.5 kg)     Objective:    Vital Signs:  BP (!) 138/94 (BP Location: Right Arm, Patient Position: Sitting, Cuff Size: Normal)   Pulse 94   Ht 5\' 11"  (1.803 m)   Wt 167 lb 4.8 oz (75.9 kg)   BMI 23.33 kg/m    VITAL SIGNS:  reviewed  ASSESSMENT & PLAN:    1. Chest discomfort: The patient's symptoms are atypical however she is very worried about her chest tightness.  We will do a Lexiscan sestamibi to reassure her.  This was scheduled in the past but it was not done because of the pandemic.  She is agreeable.  Sublingual nitroglycerin prescription was sent, its protocol and 911 protocol explained and the patient vocalized understanding questions were answered  to the patient's satisfaction  COVID-19 Education: The signs and symptoms of COVID-19 were discussed with the patient and how to seek care for testing (follow up with PCP or arrange E-visit).  The importance of social distancing was discussed today.  Time:   Today, I have spent 15 minutes with the patient with telehealth technology discussing the above problems.     Medication Adjustments/Labs and Tests Ordered: Current medicines are reviewed at length with the patient today.  Concerns regarding medicines are outlined above.   Tests Ordered: No orders of the defined types were placed in this encounter.   Medication Changes: No orders of  the defined types were placed in this encounter.   Follow Up:  Virtual Visit or In Person 46months  Signed, Garwin Brothers, MD  07/18/2019 11:18 AM    [facility]

## 2019-08-16 ENCOUNTER — Encounter (HOSPITAL_COMMUNITY): Payer: Self-pay | Admitting: *Deleted

## 2019-08-16 ENCOUNTER — Telehealth (HOSPITAL_COMMUNITY): Payer: Self-pay | Admitting: *Deleted

## 2019-08-16 NOTE — Telephone Encounter (Signed)
Left message on voicemail in reference to upcoming appointment scheduled for 08/22/19 Phone number given for a call back so details instructions can be given. Jacere Pangborn Jacqueline, RN  

## 2019-08-21 ENCOUNTER — Telehealth (HOSPITAL_COMMUNITY): Payer: Self-pay | Admitting: *Deleted

## 2019-08-21 NOTE — Telephone Encounter (Signed)
Left message on voicemail in reference to upcoming appointment scheduled for 08/22/19. Phone number given for a call back so details instructions can be given. Jenna Trevino Jacqueline   

## 2019-10-10 IMAGING — RF DG CERVICAL SPINE 1V
1 series · 1 of 1 positions shown · non-contrast
Comparison: None.

CLINICAL DATA: Anterior Cervical Decompression/ Discectomy Fusion -
Cervical Five-Cervical Six - Cervical Six-Cervical Seven. Scratch

EXAM:
DG C-ARM 61-120 MIN; DG CERVICAL SPINE - 1 VIEW

[Series 1: run · 1 of 1 slices shown]
[im 1/1]
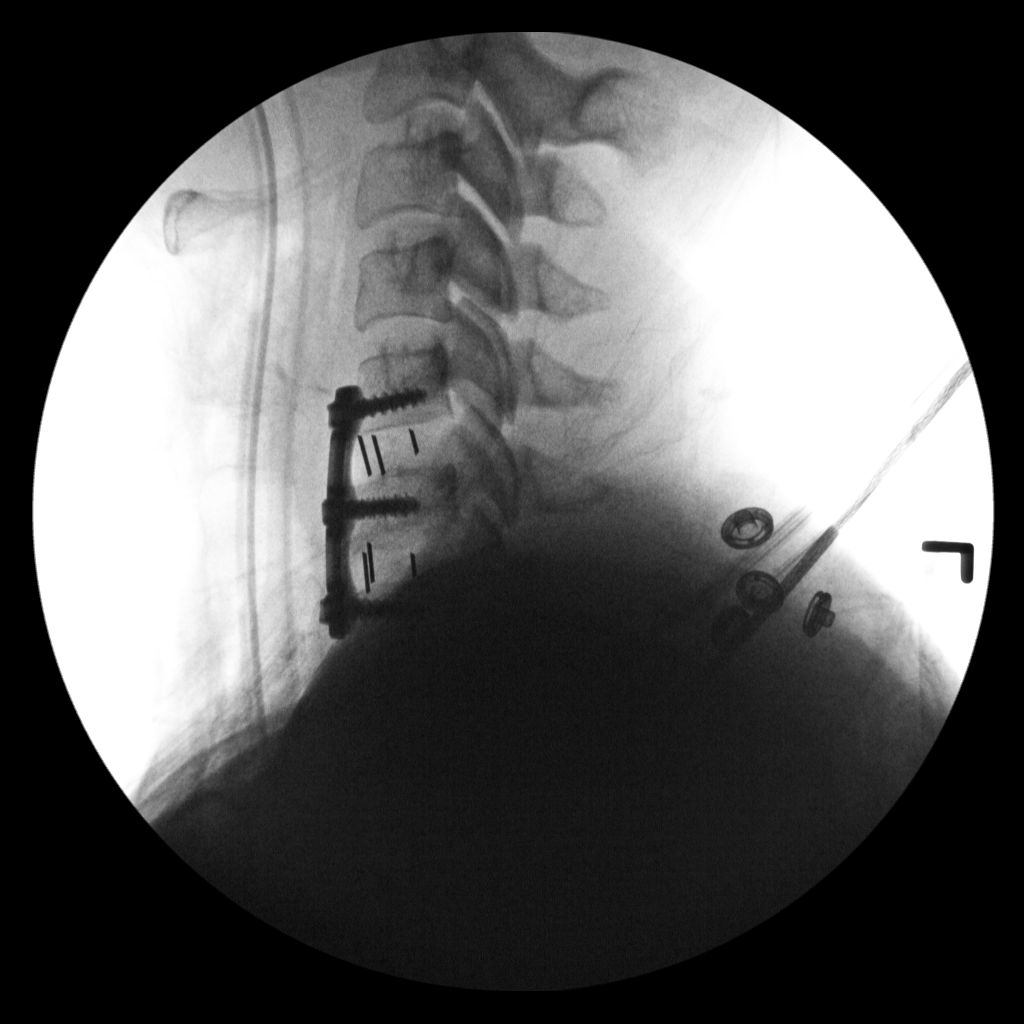

[1 of 1 positions shown; findings below may reference images not displayed]

FINDINGS: Single lateral intraoperative fluoroscopic view of the cervical
spine is provided showing anterior cervical fusion hardware at the
C5 through C7 levels, with intervening disc grafts. Hardware appears
intact and appropriately positioned.

Fluoroscopy was provided for 4 seconds.
IMPRESSION: Intraoperative fluoroscopic image demonstrating anterior cervical
fusion hardware at the C5 through C7 levels. No evidence of surgical
complicating feature.
